# Patient Record
Sex: Male | Born: 1965 | Race: Black or African American | Hispanic: No | Marital: Married | Smoking: Never smoker
Health system: Southern US, Community
[De-identification: ages and names within clinical notes are randomized; demographics above are authoritative.]

## PROBLEM LIST (undated history)

## (undated) DIAGNOSIS — Z8719 Personal history of other diseases of the digestive system: Secondary | ICD-10-CM

## (undated) DIAGNOSIS — E119 Type 2 diabetes mellitus without complications: Secondary | ICD-10-CM

## (undated) DIAGNOSIS — I350 Nonrheumatic aortic (valve) stenosis: Secondary | ICD-10-CM

## (undated) DIAGNOSIS — E785 Hyperlipidemia, unspecified: Secondary | ICD-10-CM

## (undated) DIAGNOSIS — I7789 Other specified disorders of arteries and arterioles: Secondary | ICD-10-CM

## (undated) DIAGNOSIS — N200 Calculus of kidney: Secondary | ICD-10-CM

## (undated) DIAGNOSIS — E291 Testicular hypofunction: Secondary | ICD-10-CM

## (undated) DIAGNOSIS — I5042 Chronic combined systolic (congestive) and diastolic (congestive) heart failure: Secondary | ICD-10-CM

## (undated) DIAGNOSIS — I441 Atrioventricular block, second degree: Secondary | ICD-10-CM

## (undated) DIAGNOSIS — Z9889 Other specified postprocedural states: Secondary | ICD-10-CM

## (undated) DIAGNOSIS — G4730 Sleep apnea, unspecified: Secondary | ICD-10-CM

## (undated) DIAGNOSIS — M1990 Unspecified osteoarthritis, unspecified site: Secondary | ICD-10-CM

## (undated) DIAGNOSIS — I10 Essential (primary) hypertension: Secondary | ICD-10-CM

## (undated) DIAGNOSIS — I428 Other cardiomyopathies: Secondary | ICD-10-CM

## (undated) HISTORY — DX: Other specified postprocedural states: Z98.890

## (undated) HISTORY — DX: Hyperlipidemia, unspecified: E78.5

## (undated) HISTORY — DX: Other specified disorders of arteries and arterioles: I77.89

## (undated) HISTORY — DX: Chronic combined systolic (congestive) and diastolic (congestive) heart failure: I50.42

## (undated) HISTORY — PX: COSMETIC SURGERY: SHX468

## (undated) HISTORY — DX: Atrioventricular block, second degree: I44.1

## (undated) HISTORY — DX: Type 2 diabetes mellitus without complications: E11.9

## (undated) HISTORY — DX: Other cardiomyopathies: I42.8

## (undated) HISTORY — PX: CARDIAC CATHETERIZATION: SHX172

---

## 1998-03-11 ENCOUNTER — Emergency Department (HOSPITAL_COMMUNITY): Admission: EM | Admit: 1998-03-11 | Discharge: 1998-03-11 | Payer: Self-pay | Admitting: Emergency Medicine

## 2002-02-04 ENCOUNTER — Ambulatory Visit (HOSPITAL_BASED_OUTPATIENT_CLINIC_OR_DEPARTMENT_OTHER): Admission: RE | Admit: 2002-02-04 | Discharge: 2002-02-04 | Payer: Self-pay | Admitting: *Deleted

## 2008-03-11 ENCOUNTER — Emergency Department (HOSPITAL_COMMUNITY): Admission: EM | Admit: 2008-03-11 | Discharge: 2008-03-11 | Payer: Self-pay | Admitting: Emergency Medicine

## 2011-03-13 LAB — POCT I-STAT, CHEM 8
Calcium, Ion: 1.09 — ABNORMAL LOW
Glucose, Bld: 127 — ABNORMAL HIGH
HCT: 49
Hemoglobin: 16.7
TCO2: 28

## 2011-03-13 LAB — CBC
Hemoglobin: 15.3
MCHC: 33.3
MCV: 83.6
RBC: 5.49
RDW: 14.7

## 2011-03-13 LAB — DIFFERENTIAL
Basophils Absolute: 0
Basophils Relative: 0
Eosinophils Absolute: 0.1
Eosinophils Relative: 3
Monocytes Absolute: 0.5
Monocytes Relative: 11

## 2011-03-13 LAB — URINALYSIS, ROUTINE W REFLEX MICROSCOPIC
Bilirubin Urine: NEGATIVE
Hgb urine dipstick: NEGATIVE
Ketones, ur: NEGATIVE
Nitrite: NEGATIVE
pH: 6

## 2011-05-30 ENCOUNTER — Inpatient Hospital Stay: Admit: 2011-05-30 | Payer: Self-pay | Source: Ambulatory Visit | Admitting: Interventional Cardiology

## 2011-05-30 ENCOUNTER — Emergency Department (HOSPITAL_COMMUNITY): Payer: BC Managed Care – PPO

## 2011-05-30 ENCOUNTER — Other Ambulatory Visit: Payer: Self-pay

## 2011-05-30 ENCOUNTER — Inpatient Hospital Stay (HOSPITAL_COMMUNITY)
Admission: EM | Admit: 2011-05-30 | Discharge: 2011-06-02 | DRG: 124 | Disposition: A | Discharge status: Home / Self Care | Payer: BC Managed Care – PPO | Attending: Interventional Cardiology | Admitting: Interventional Cardiology

## 2011-05-30 DIAGNOSIS — I5023 Acute on chronic systolic (congestive) heart failure: Principal | ICD-10-CM | POA: Diagnosis present

## 2011-05-30 DIAGNOSIS — G4730 Sleep apnea, unspecified: Secondary | ICD-10-CM | POA: Diagnosis present

## 2011-05-30 DIAGNOSIS — Q231 Congenital insufficiency of aortic valve: Secondary | ICD-10-CM

## 2011-05-30 DIAGNOSIS — I441 Atrioventricular block, second degree: Secondary | ICD-10-CM | POA: Diagnosis present

## 2011-05-30 DIAGNOSIS — I5022 Chronic systolic (congestive) heart failure: Secondary | ICD-10-CM | POA: Diagnosis present

## 2011-05-30 DIAGNOSIS — N200 Calculus of kidney: Secondary | ICD-10-CM

## 2011-05-30 DIAGNOSIS — I10 Essential (primary) hypertension: Secondary | ICD-10-CM | POA: Diagnosis present

## 2011-05-30 DIAGNOSIS — R079 Chest pain, unspecified: Secondary | ICD-10-CM | POA: Diagnosis present

## 2011-05-30 DIAGNOSIS — I350 Nonrheumatic aortic (valve) stenosis: Secondary | ICD-10-CM | POA: Diagnosis present

## 2011-05-30 DIAGNOSIS — E291 Testicular hypofunction: Secondary | ICD-10-CM | POA: Diagnosis present

## 2011-05-30 HISTORY — DX: Unspecified osteoarthritis, unspecified site: M19.90

## 2011-05-30 HISTORY — DX: Calculus of kidney: N20.0

## 2011-05-30 HISTORY — DX: Essential (primary) hypertension: I10

## 2011-05-30 HISTORY — DX: Sleep apnea, unspecified: G47.30

## 2011-05-30 HISTORY — DX: Nonrheumatic aortic (valve) stenosis: I35.0

## 2011-05-30 HISTORY — DX: Testicular hypofunction: E29.1

## 2011-05-30 LAB — COMPREHENSIVE METABOLIC PANEL
ALT: 61 U/L — ABNORMAL HIGH (ref 0–53)
Albumin: 3.7 g/dL (ref 3.5–5.2)
Alkaline Phosphatase: 48 U/L (ref 39–117)
Calcium: 9.6 mg/dL (ref 8.4–10.5)
GFR calc Af Amer: 76 mL/min — ABNORMAL LOW (ref >90)
Glucose, Bld: 89 mg/dL (ref 70–99)
Potassium: 3.8 mEq/L (ref 3.5–5.1)
Sodium: 138 mEq/L (ref 135–145)
Total Protein: 7 g/dL (ref 6.0–8.3)

## 2011-05-30 LAB — CARDIAC PANEL(CRET KIN+CKTOT+MB+TROPI)
Relative Index: 1.9 (ref 0.0–2.5)
Total CK: 596 U/L — ABNORMAL HIGH (ref 7–232)
Troponin I: <0.3 ng/mL (ref <0.30)

## 2011-05-30 LAB — CBC
HCT: 53 % — ABNORMAL HIGH (ref 39.0–52.0)
Hemoglobin: 18.5 g/dL — ABNORMAL HIGH (ref 13.0–17.0)
RBC: 6.26 MIL/uL — ABNORMAL HIGH (ref 4.22–5.81)

## 2011-05-30 LAB — TSH: TSH: 2.768 u[IU]/mL (ref 0.350–4.500)

## 2011-05-30 LAB — PRO B NATRIURETIC PEPTIDE: Pro B Natriuretic peptide (BNP): 1100 pg/mL — ABNORMAL HIGH (ref 0–125)

## 2011-05-30 LAB — MAGNESIUM: Magnesium: 2 mg/dL (ref 1.5–2.5)

## 2011-05-30 LAB — D-DIMER, QUANTITATIVE: D-Dimer, Quant: 0.43 ug/mL-FEU (ref 0.00–0.48)

## 2011-05-30 MED ORDER — AMLODIPINE BESYLATE 5 MG PO TABS
5.0000 mg | ORAL_TABLET | Freq: Every day | ORAL | Status: DC
  Filled 2011-05-30: qty 1

## 2011-05-30 MED ORDER — HEPARIN BOLUS VIA INFUSION
4000.0000 [IU] | Freq: Once | INTRAVENOUS | Status: AC
  Administered 2011-05-30: 4000 [IU] via INTRAVENOUS
  Filled 2011-05-30: qty 4000

## 2011-05-30 MED ORDER — SODIUM CHLORIDE 0.9 % IJ SOLN: 3.0000 mL | INTRAMUSCULAR | Status: DC | PRN

## 2011-05-30 MED ORDER — OLMESARTAN MEDOXOMIL 40 MG PO TABS
40.0000 mg | ORAL_TABLET | ORAL | Status: AC
Start: 2011-05-30 — End: 2011-05-31
  Filled 2011-05-30: qty 1

## 2011-05-30 MED ORDER — HEPARIN SOD (PORCINE) IN D5W 100 UNIT/ML IV SOLN
1950.0000 [IU]/h | INTRAVENOUS | Status: DC
  Administered 2011-05-30: 1600 [IU]/h via INTRAVENOUS
  Administered 2011-05-31 – 2011-06-01 (×3): 1950 [IU]/h via INTRAVENOUS
  Filled 2011-05-30 (×7): qty 250

## 2011-05-30 MED ORDER — HYDROCHLOROTHIAZIDE 25 MG PO TABS
25.0000 mg | ORAL_TABLET | ORAL | Status: DC
  Filled 2011-05-30: qty 1

## 2011-05-30 MED ORDER — OLMESARTAN-AMLODIPINE-HCTZ 40-5-25 MG PO TABS: 1.0000 | ORAL_TABLET | Freq: Every day | ORAL | Status: DC

## 2011-05-30 MED ORDER — ACETAMINOPHEN 325 MG PO TABS: 650.0000 mg | ORAL_TABLET | ORAL | Status: DC | PRN

## 2011-05-30 MED ORDER — ONDANSETRON HCL 4 MG/2ML IJ SOLN: 4.0000 mg | Freq: Four times a day (QID) | INTRAMUSCULAR | Status: DC | PRN

## 2011-05-30 MED ORDER — HYDROCHLOROTHIAZIDE 25 MG PO TABS
25.0000 mg | ORAL_TABLET | Freq: Every day | ORAL | Status: DC
  Filled 2011-05-30: qty 1

## 2011-05-30 MED ORDER — AMLODIPINE BESYLATE 5 MG PO TABS
5.0000 mg | ORAL_TABLET | ORAL | Status: DC
  Filled 2011-05-30: qty 1

## 2011-05-30 MED ORDER — FUROSEMIDE 10 MG/ML IJ SOLN
40.0000 mg | Freq: Once | INTRAMUSCULAR | Status: AC
  Administered 2011-05-30: 40 mg via INTRAVENOUS
  Filled 2011-05-30 (×2): qty 4

## 2011-05-30 MED ORDER — SODIUM CHLORIDE 0.9 % IJ SOLN
3.0000 mL | Freq: Two times a day (BID) | INTRAMUSCULAR | Status: DC
  Administered 2011-05-31: 3 mL via INTRAVENOUS

## 2011-05-30 MED ORDER — OLMESARTAN MEDOXOMIL 40 MG PO TABS
40.0000 mg | ORAL_TABLET | Freq: Every day | ORAL | Status: DC
  Filled 2011-05-30: qty 1

## 2011-05-30 MED ORDER — SODIUM CHLORIDE 0.9 % IV SOLN: 250.0000 mL | INTRAVENOUS | Status: DC | PRN

## 2011-05-30 MED ORDER — OLMESARTAN MEDOXOMIL 40 MG PO TABS
40.0000 mg | ORAL_TABLET | Freq: Every day | ORAL | Status: DC
  Administered 2011-05-31 – 2011-06-02 (×3): 40 mg via ORAL
  Filled 2011-05-30 (×3): qty 1

## 2011-05-30 NOTE — ED Notes (Signed)
Pt presents with 2 day h/o chest tightness.  Pt reports shortness of breath; pt reports coughing, unable to produce phlegm.  Pt reports shortness of breath worsens with exertion.

## 2011-05-30 NOTE — ED Notes (Signed)
RN unable to come to phone.  Phone number and name given to Diplomatic Services operational officer.   RN to call back.

## 2011-05-30 NOTE — ED Notes (Signed)
Called IV team  

## 2011-05-30 NOTE — ED Notes (Signed)
RN unable to come to phone.  Phone number and name given.  Will return phone call in a few minutes.

## 2011-05-30 NOTE — Progress Notes (Signed)
ANTICOAGULATION CONSULT NOTE - Initial Consult  Pharmacy Consult for heparin Indication: ACS  Allergies  Allergen Reactions  . Atenolol Swelling    Erectile dysfunction    Patient Measurements: Height: 6\' 4"  (193 cm) Weight: 338 lb (153.316 kg) IBW/kg (Calculated) : 86.8  Heparin dosing Weight: 121.9kg  Vital Signs: Temp: 98.2 F (36.8 C) (12/18 1716) Temp src: Oral (12/18 1716) BP: 146/103 mmHg (12/18 1716) Pulse Rate: 68  (12/18 1716)  Labs:  Basename 05/30/11 1601 05/30/11 1539  HGB -- 18.5*  HCT -- 53.0*  PLT -- 263  APTT -- --  LABPROT -- 14.3  INR -- 1.09  HEPARINUNFRC -- --  CREATININE -- --  CKTOTAL 596* --  CKMB 11.5* --  TROPONINI <0.30 --   Estimated Creatinine Clearance: 117.5 ml/min (by C-G formula based on Cr of 1.3).  Medical History: Past Medical History  Diagnosis Date  . Hypertension   . Aortic stenosis   . Kidney stone     nephrolithiasis  . Hypogonadism male     Medications (PTA):  Ibuprofen, magnesium oxide, tribensor, levitra, testosterone  Assessment: 45 yo male with SOB/CP and concern for ACS to start heparin  Goal of Therapy:  Heparin level 0.3-0.7 units/ml   Plan:  1) Will given heparin bolus of 4000 units then infuse at 1600 units/hr (~12 units/kg/hr). 2) Heparin level in 6 hours and daily with CBC daily   Benny Lennert 05/30/2011,5:49 PM

## 2011-05-30 NOTE — ED Notes (Signed)
Pt to ED for eval of abnormal EKG. Pt states that last night at work he had an episode of sob and chest tightness. Pt reports he also had abdominal discomfort that was relieved by antacids. Pt denies any chest pain, sob, abd pain at this time. Pt moved to yellow and placed back on cardiac monitor. Wife at bedside. Pt with noted non pitting edema to lower extremities. Pt reports he had episodes of sob intermittently a couple months ago with he was going to the gym. Pt is aware of poc. Pt and wife given drinks.

## 2011-05-30 NOTE — ED Notes (Signed)
Meal tray ordered 

## 2011-05-30 NOTE — ED Notes (Signed)
[id number] ready

## 2011-05-30 NOTE — ED Notes (Signed)
IV team at bedside 

## 2011-05-30 NOTE — ED Notes (Signed)
Pt was at [facility] cardio for shortness of breath issues for past week.  Pt EKG showed 1st degree heart block.  Sent to ED to see Dr Mayford Knife. ED EKG did not show the heart block.  MD turner is admitting pt for further testing.   Pt states that he was bending over at work and started having shortness of breath.  Denies pain and pressure.  States he had very bad heartburn last night but was relieved by antacids

## 2011-05-30 NOTE — ED Notes (Signed)
Dr Mayford Knife is aware of CKMB, new orders received

## 2011-05-30 NOTE — ED Notes (Signed)
Dr. Turner at bedside.

## 2011-05-30 NOTE — H&P (Signed)
Admit date: 05/30/2011 Referring Physician Dr. Laurann Montana Primary Cardiologist  Dr. Verdis Prime Chief complaint/reason for admission:   SOB, intermittent second degree AV block, chest pressure  HPI: This is a 45yo African American Male with history of mild to moderate AS and mild AI by echo 2011, Hypertension who presented to Dr. Lucilla Lame office today with complaints of increased SOB and chest pressure.  Over the past week he has noticed a sensation of "fluid" in his right lung when he lays down.  He has had an occasional cough but no fever, chills or productivity to the cough.  Last evening while working at his job [employer] at [employer], he noticed that he was SOB whenever he bent over.  He denied any chest pain but noticed some tightness in his chest.  He also has noticed swelling in his legs over the past week and says that the only time he normally has some edema is in the summer.  He currently denies any chest pain.  In Dr. Lucilla Lame office he was noticed to have intermitten 2:1 second degree AV block.  He was transported to the ER and in the ER his EKG now shows NSR with inverted T waves in the lateral precordial leads    PMH:    Past Medical History  Diagnosis Date  . Hypertension   . Mild to moderate AS and mild AI by echo 2011   . Kidney stone     nephrolithiasis  . Hypogonadism male Erectile dysfunction      PSH:   History reviewed. No pertinent past surgical history.  ALLERGIES:   Atenolol (causes erectile dysfunction and edema)  Prior to Admit Meds:    Magnesium ? Dose  Ibuprofen 200mg  1 tablet as needed  Levitra 20mg  1 tablet as needed   Testosterone injection every 2 weeks  Bystolic 10mg  daily  Tribenzor 40-5-25mg  1 tablet daily Family HX:   History reviewed. No pertinent family history. Social HX:    History   Social History  . Marital Status: Married    Spouse Name: N/A    Number of Children: N/A  . Years of Education: N/A   Occupational History  .  Not on file.   Social History Main Topics  . Smoking status: Never Smoker   . Smokeless tobacco: Not on file  . Alcohol Use: No  . Drug Use: No  . Sexually Active:    Other Topics Concern  . Not on file   Social History Narrative  . No narrative on file     ROS:  All 11 ROS were addressed and are negative except what is stated in the HPI  PHYSICAL EXAM Filed Vitals:   05/30/11 1336  BP: 134/105  Pulse: 68  Temp: 98.4 F (36.9 C)  Resp: 20   General: Well developed, well nourished, in no acute distress Head: Eyes PERRLA, No xanthomas.   Normal cephalic and atramatic  Lungs:   Few crackles at right base otherwise CTA. Heart:   HRRR S1 S2 Pulses are 2+ & equal.            No carotid bruit. No JVD.  No abdominal bruits. No femoral bruits. Abdomen: Bowel sounds are positive, abdomen soft and non-tender without masses  Extremities:  1+ edema bilaterally Neuro: Alert and oriented X 3. Psych:  Good affect, responds appropriately   Labs:   Lab Results  Component Value Date   WBC 4.9 03/11/2008   HGB 16.7 03/11/2008  HCT 49.0 03/11/2008   MCV 83.6 03/11/2008   PLT 235 03/11/2008         Radiology:  Pending  EKG:  NSR with1st degree AV block,  nonspecific T wave abnormality V5 and V6, Biatrial enlargement  EKG in MD office NSR with 2:1 AV block  ASSESSMENT:  1.  Transient second degree AV block now in NSR 2.  SOB most likely secondary to CHF 3. Probable  Acute diastolic CHF 4.  Mild to moderate AS and mild AI by echo 2011 5.  Mild chest pressure now resolved most likely secondary to #3  PLAN:   1.  Admit to tele bed 2.  Stop bystolic 3.  Stat CMET, BNP, TSH, DDimer 4.  Lasix 40mg  IV now 5.  Pa and lat CXRAY 6.  2D echo reassess AS and LVF 7.  Further workup per Dr. Flossie Dibble, MD  05/30/2011  2:56 PM

## 2011-05-31 ENCOUNTER — Other Ambulatory Visit: Payer: Self-pay

## 2011-05-31 DIAGNOSIS — I10 Essential (primary) hypertension: Secondary | ICD-10-CM

## 2011-05-31 DIAGNOSIS — I441 Atrioventricular block, second degree: Secondary | ICD-10-CM

## 2011-05-31 LAB — BASIC METABOLIC PANEL
Calcium: 9.4 mg/dL (ref 8.4–10.5)
GFR calc Af Amer: 67 mL/min — ABNORMAL LOW (ref >90)
GFR calc non Af Amer: 58 mL/min — ABNORMAL LOW (ref >90)
Potassium: 3.4 mEq/L — ABNORMAL LOW (ref 3.5–5.1)
Sodium: 139 mEq/L (ref 135–145)

## 2011-05-31 LAB — CBC
Hemoglobin: 17.9 g/dL — ABNORMAL HIGH (ref 13.0–17.0)
MCH: 29.2 pg (ref 26.0–34.0)
Platelets: 264 10*3/uL (ref 150–400)
RBC: 6.13 MIL/uL — ABNORMAL HIGH (ref 4.22–5.81)
WBC: 6.5 10*3/uL (ref 4.0–10.5)

## 2011-05-31 LAB — CARDIAC PANEL(CRET KIN+CKTOT+MB+TROPI)
CK, MB: 6.5 ng/mL (ref 0.3–4.0)
Relative Index: 1.8 (ref 0.0–2.5)
Relative Index: 1.8 (ref 0.0–2.5)
Total CK: 355 U/L — ABNORMAL HIGH (ref 7–232)
Troponin I: <0.3 ng/mL (ref <0.30)

## 2011-05-31 LAB — HEPARIN LEVEL (UNFRACTIONATED)
Heparin Unfractionated: 0.14 IU/mL — ABNORMAL LOW (ref 0.30–0.70)
Heparin Unfractionated: 0.43 IU/mL (ref 0.30–0.70)

## 2011-05-31 LAB — APTT: aPTT: 62 seconds — ABNORMAL HIGH (ref 24–37)

## 2011-05-31 MED ORDER — HEPARIN BOLUS VIA INFUSION
2500.0000 [IU] | Freq: Once | INTRAVENOUS | Status: AC
  Administered 2011-05-31: 2500 [IU] via INTRAVENOUS
  Filled 2011-05-31: qty 2500

## 2011-05-31 MED ORDER — AMLODIPINE BESYLATE 10 MG PO TABS
10.0000 mg | ORAL_TABLET | Freq: Every day | ORAL | Status: DC
  Administered 2011-05-31 – 2011-06-02 (×3): 10 mg via ORAL
  Filled 2011-05-31 (×3): qty 1

## 2011-05-31 NOTE — Progress Notes (Addendum)
Patient Name: Joshua [name] Date of Encounter: 05/31/2011    SUBJECTIVE: Feels better.    TELEMETRY:  Occasional second-degree AV block, ? Mobitz 1 as there is slight post block PR shortening. Filed Vitals:   05/30/11 2000 05/30/11 2100 05/30/11 2300 05/31/11 0501  BP: 142/97 148/103 157/111 159/96  Pulse: 66 67 73 76  Temp:  98.4 F (36.9 C) 98.3 F (36.8 C) 97.6 F (36.4 C)  TempSrc:  Oral Oral Oral  Resp: 19 20 18 18   Height:      Weight:   150.367 kg (331 lb 8 oz)   SpO2: 96% 95% 95% 94%    Intake/Output Summary (Last 24 hours) at 05/31/11 0907 Last data filed at 05/31/11 0636  Gross per 24 hour  Intake 769.93 ml  Output      0 ml  Net 769.93 ml    LABS: Basic Metabolic Panel:  Basename 05/31/11 0229 05/30/11 1539  NA 139 138  K 3.4* 3.8  CL 99 98  CO2 30 24  GLUCOSE 92 89  BUN 16 14  CREATININE 1.44* 1.31  CALCIUM 9.4 9.6  MG -- 2.0  PHOS -- --   CBC:  Basename 05/31/11 0229 05/30/11 1539  WBC 6.5 6.9  NEUTROABS -- --  HGB 17.9* 18.5*  HCT 51.6 53.0*  MCV 84.2 84.7  PLT 264 263   Cardiac Enzymes:  Basename 05/30/11 2347 05/30/11 1601  CKTOTAL 484* 596*  CKMB 8.7* 11.5*  CKMBINDEX -- --  TROPONINI <0.30 <0.30   BNP: 1100  ECG:EKG on admission demonstrated marked atrial abnormality with mild first-degree AV block. No acute ST-T wave changes.  Radiology/Studies:   CXR: IMPRESSION:  No evidence of acute cardiopulmonary disease.  Mild cardiomegaly.  Original Report Authenticated By: Charline Bills, M.D.   Physical Exam: Blood pressure 159/96, pulse 76, temperature 97.6 F (36.4 C), temperature source Oral, resp. rate 18, height 6\' 4"  (1.93 m), weight 150.367 kg (331 lb 8 oz), SpO2 94.00%. Weight change:    S4 gallop. 2-3 of 6 crescendo decrescendo systolic murmur and also to of 6 decrescendo murmur of aortic regurgitation  ASSESSMENT:  1. AV block, intrinsic, aggravated by beta blocker therapy. AV disease could be related to  the patient's known bowel heart disease.  2. Aortic valve disease. Will reevaluate with echo this admission.  3. Acute diastolic heart failure secondary to dysrhythmia.  4. Poorly controlled hypertension  5. Elevated CK/MB with normal troponins.   Plan:  1. Diuresis  2. EP evaluation, ? pacer  3. Further management decisions will depend on the appearance of echocardiogram.  4. Intensify antihypertensive regimen, by increasing amlodipine  5. Increase activity  6. May need ischemic eval.  Signed, Lesleigh Noe 05/31/2011, 9:07 AM

## 2011-05-31 NOTE — Progress Notes (Signed)
ANTICOAGULATION CONSULT NOTE - Follow Up Consult  Pharmacy Consult:  Heparin Indication: ACS  Allergies  Allergen Reactions  . Atenolol Swelling    Erectile dysfunction    Patient Measurements: Height: 6\' 4"  (193 cm) Weight: 331 lb 8 oz (150.367 kg) IBW/kg (Calculated) : 86.8   Vital Signs: Temp: 97.6 F (36.4 C) (12/19 0501) Temp src: Oral (12/19 0501) BP: 159/96 mmHg (12/19 0501) Pulse Rate: 76  (12/19 0501)  Labs:  Basename 05/31/11 1200 05/31/11 0905 05/31/11 0229 05/30/11 2347 05/30/11 2346 05/30/11 1601 05/30/11 1539  HGB -- -- 17.9* -- -- -- 18.5*  HCT -- -- 51.6 -- -- -- 53.0*  PLT -- -- 264 -- -- -- 263  APTT -- -- -- -- 62* -- --  LABPROT -- -- -- -- -- -- 14.3  INR -- -- -- -- -- -- 1.09  HEPARINUNFRC 0.43 -- 0.14* -- -- -- --  CREATININE -- -- 1.44* -- -- -- 1.31  CKTOTAL -- 355* -- 484* -- 596* --  CKMB -- 6.5* -- 8.7* -- 11.5* --  TROPONINI -- <0.30 -- <0.30 -- <0.30 --   Estimated Creatinine Clearance: 105 ml/min (by C-G formula based on Cr of 1.44).   Assessment: 45 YOM on heparin for ACS.  Heparin level therapeutic post rate increase.  No bleeding documented.  Goal of Therapy:  HL 0.3-0.7   Plan:  1.  Continue heparin gtt at 1950 units/hr 2.  F/U AM labs, ECHO   Phillips Climes Dien 05/31/2011,12:58 PM

## 2011-05-31 NOTE — Progress Notes (Signed)
UR Completed.  Joshua [name] 05/31/2011 [phone]  

## 2011-05-31 NOTE — Consult Note (Signed)
ELECTROPHYSIOLOGY CONSULT NOTE    Patient ID: Joshua [name] MRN: [id number], DOB/AGE: 03/03/1966 45 y.o.  Admit date: 05/30/2011 Date of Consult: 05-31-2011  Primary Cardiologist: Verdis Prime, MD  Reason for Consultation: AV block  HPI:  Joshua [name] is a 45yo male with history of mild to moderate AS and mild AI by echo 2011 and hypertension who presented to Dr. Lucilla Lame office today with complaints of increased SOB and chest pressure. Over the past week he has noticed a sensation of "fluid" in his right lung when he lays down. He has had an occasional cough but no fever, chills or productivity to the cough. Last evening while working at his job [employer] at [employer], he noticed that he was SOB whenever he bent over he reports associated tightness in his chest. He also has noticed swelling in his legs over the past week and says that the only time he normally has some edema is in the summer. He currently denies any chest pain. In Dr. Lucilla Lame office he was noticed to have intermitten 2:1 second degree AV block.  He was therefore admitted to [facility] for further evaluation.  AV nodal blocking medications on admission included Bystolic which has since been discontinued- last dose yesterday.   Past Medical History  Diagnosis Date  . Hypertension   . Aortic stenosis   . Kidney stone     nephrolithiasis  . Hypogonadism male      Surgical History: History reviewed. No pertinent past surgical history.   Prescriptions prior to admission  Medication Sig Dispense Refill  . ibuprofen (ADVIL,MOTRIN) 200 MG tablet Take 200 mg by mouth every 6 (six) hours as needed. For pain      . magnesium oxide (MAG-OX) 400 MG tablet Take 400 mg by mouth daily.        . nebivolol (BYSTOLIC) 10 MG tablet Take 10 mg by mouth daily.        . Olmesartan-Amlodipine-HCTZ (TRIBENZOR) 40-5-25 MG TABS Take 1 tablet by mouth daily.        Marland Kitchen testosterone cypionate (DEPOTESTOTERONE CYPIONATE) 200 MG/ML  injection Inject into the muscle every 14 (fourteen) days.       . vardenafil (LEVITRA) 20 MG tablet Take 20 mg by mouth daily as needed.          Inpatient Medications:    . amLODipine  10 mg Oral Daily  . furosemide  40 mg Intravenous Once  . heparin  2,500 Units Intravenous Once  . heparin  4,000 Units Intravenous Once  . olmesartan  40 mg Oral To Minor  . olmesartan  40 mg Oral Daily    Allergies:  Allergies  Allergen Reactions  . Atenolol Swelling    Erectile dysfunction    History   Social History  . Marital Status: Married    Spouse Name: N/A    Number of Children: N/A  . Years of Education: N/A   Occupational History  . Not on file.   Social History Main Topics  . Smoking status: Never Smoker   . Smokeless tobacco: Not on file  . Alcohol Use: No  . Drug Use: No  . Sexually Active:    Other Topics Concern  . Not on file   Social History Narrative  . No narrative on file    FH:  Denies FH of early CAD, or premature conduction disease  Labs:   Lab Results  Component Value Date   WBC 6.5 05/31/2011  HGB 17.9* 05/31/2011   HCT 51.6 05/31/2011   MCV 84.2 05/31/2011   PLT 264 05/31/2011    Lab 05/31/11 0229 05/30/11 1539  NA 139 --  K 3.4* --  CL 99 --  CO2 30 --  BUN 16 --  CREATININE 1.44* --  CALCIUM 9.4 --  PROT -- 7.0  BILITOT -- 0.5  ALKPHOS -- 48  ALT -- 61*  AST -- 54*  GLUCOSE 92 --   Lab Results  Component Value Date   CKTOTAL 355* 05/31/2011   CKMB 6.5* 05/31/2011   TROPONINI <0.30 05/31/2011    Lab Results  Component Value Date   DDIMER 0.43 05/30/2011     Radiology/Studies: Dg Chest 2 View 05/30/2011  *RADIOLOGY REPORT*  Clinical Data: Shortness of breath  CHEST - 2 VIEW  Comparison: None.  Findings: Lungs are essentially clear. No pleural effusion or pneumothorax.  Mild cardiomegaly.  Mild degenerative changes of the visualized thoracolumbar spine.  IMPRESSION: No evidence of acute cardiopulmonary disease.  Mild  cardiomegaly.  Original Report Authenticated By: Charline Bills, M.D.   TELEMETRY: Sinus rhythm with intermittent 2:1 block.  Occasional PVC's, narrow QRS  EKG reveals sinus rhythm 67 bpm, PR 220, QRS 98, Qtc 448, biatrial enlargement, nonspecific ST/T changes  Echo- pending  Assessment/Impression   Second degree AV block-  Joshua [name] is admitted with symptomatic 2:1 AV block.  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  He is chronically treated with bystolic and this has been discontinued.  He has symptoms of chest discomfort, SOB, and worsening LE edema recently.  I agree with Dr Katrinka Blazing that it would be important to exclude ischemia.  Given AV block, I would consider cath as lexiscan and GXT would be higher risk stress modalities for this patient with poor AV conduction. His Echo is pending.  I think that close inspection of the Aortic valve and septum is important to look for structural causes of AV block (such as increased calcification or infiltrative process). If AV conduction completely normalizes off of bystolic, we may be able to avoid PPM, however if further AV block occurs after bystolic washout, we may have to proceed with PPM.  Will follow closely with you.  Hillis Range, MD 4:41 PM 05/31/2011

## 2011-05-31 NOTE — Progress Notes (Signed)
Initial heparin level 0.14 at 1600 units/hr. No bleeding reported. Will rebolus 2500 unitsx1 and increase to1950 units/hr (16u/kg/hr). Will recheck next heparin level at 11am

## 2011-06-01 ENCOUNTER — Encounter (HOSPITAL_COMMUNITY): Payer: Self-pay | Admitting: General Practice

## 2011-06-01 ENCOUNTER — Other Ambulatory Visit: Payer: Self-pay

## 2011-06-01 ENCOUNTER — Encounter (HOSPITAL_COMMUNITY)
Admission: EM | Disposition: A | Discharge status: Home / Self Care | Payer: Self-pay | Source: Home / Self Care | Attending: Interventional Cardiology

## 2011-06-01 HISTORY — PX: LEFT HEART CATHETERIZATION WITH CORONARY ANGIOGRAM: SHX5451

## 2011-06-01 LAB — CBC
Hemoglobin: 18.2 g/dL — ABNORMAL HIGH (ref 13.0–17.0)
MCH: 28.9 pg (ref 26.0–34.0)
Platelets: 241 10*3/uL (ref 150–400)
RBC: 6.3 MIL/uL — ABNORMAL HIGH (ref 4.22–5.81)
WBC: 6.4 10*3/uL (ref 4.0–10.5)

## 2011-06-01 LAB — BASIC METABOLIC PANEL
BUN: 14 mg/dL (ref 6–23)
Chloride: 102 mEq/L (ref 96–112)
GFR calc Af Amer: 80 mL/min — ABNORMAL LOW (ref >90)
GFR calc non Af Amer: 69 mL/min — ABNORMAL LOW (ref >90)
Potassium: 3.7 mEq/L (ref 3.5–5.1)
Sodium: 140 mEq/L (ref 135–145)

## 2011-06-01 LAB — HEPARIN LEVEL (UNFRACTIONATED): Heparin Unfractionated: 0.33 IU/mL (ref 0.30–0.70)

## 2011-06-01 SURGERY — LEFT HEART CATHETERIZATION WITH CORONARY ANGIOGRAM
Anesthesia: LOCAL

## 2011-06-01 MED ORDER — ASPIRIN 81 MG PO CHEW: 324.0000 mg | CHEWABLE_TABLET | ORAL | Status: DC

## 2011-06-01 MED ORDER — SODIUM CHLORIDE 0.9 % IV SOLN
INTRAVENOUS | Status: DC
  Administered 2011-06-01 (×2): via INTRAVENOUS

## 2011-06-01 MED ORDER — HEPARIN SODIUM (PORCINE) 1000 UNIT/ML IJ SOLN
INTRAMUSCULAR | Status: AC
  Filled 2011-06-01: qty 1

## 2011-06-01 MED ORDER — VERAPAMIL HCL 2.5 MG/ML IV SOLN
INTRAVENOUS | Status: AC
  Filled 2011-06-01: qty 2

## 2011-06-01 MED ORDER — NITROGLYCERIN 0.2 MG/ML ON CALL CATH LAB
INTRAVENOUS | Status: AC
  Filled 2011-06-01: qty 1

## 2011-06-01 MED ORDER — OXYCODONE-ACETAMINOPHEN 5-325 MG PO TABS
1.0000 | ORAL_TABLET | ORAL | Status: DC | PRN
  Administered 2011-06-02: 1 via ORAL
  Filled 2011-06-01: qty 1

## 2011-06-01 MED ORDER — DIAZEPAM 5 MG PO TABS
5.0000 mg | ORAL_TABLET | ORAL | Status: AC
  Administered 2011-06-01: 5 mg via ORAL
  Filled 2011-06-01: qty 1

## 2011-06-01 MED ORDER — FENTANYL CITRATE 0.05 MG/ML IJ SOLN
INTRAMUSCULAR | Status: AC
  Filled 2011-06-01: qty 2

## 2011-06-01 MED ORDER — SODIUM CHLORIDE 0.9 % IV SOLN: INTRAVENOUS | Status: AC

## 2011-06-01 MED ORDER — FUROSEMIDE 40 MG PO TABS
40.0000 mg | ORAL_TABLET | Freq: Every day | ORAL | Status: DC
  Administered 2011-06-01 – 2011-06-02 (×2): 40 mg via ORAL
  Filled 2011-06-01 (×2): qty 1

## 2011-06-01 MED ORDER — MIDAZOLAM HCL 2 MG/2ML IJ SOLN
INTRAMUSCULAR | Status: AC
  Filled 2011-06-01: qty 2

## 2011-06-01 MED ORDER — LIDOCAINE HCL (PF) 1 % IJ SOLN
INTRAMUSCULAR | Status: AC
  Filled 2011-06-01: qty 30

## 2011-06-01 MED ORDER — SODIUM CHLORIDE 0.9 % IV SOLN: INTRAVENOUS | Status: DC

## 2011-06-01 MED ORDER — HEPARIN (PORCINE) IN NACL 2-0.9 UNIT/ML-% IJ SOLN
INTRAMUSCULAR | Status: AC
  Filled 2011-06-01: qty 2000

## 2011-06-01 MED FILL — Perflutren Lipid Microsphere IV Susp 6.52 MG/ML: INTRAVENOUS | Qty: 2 | Status: AC

## 2011-06-01 NOTE — Op Note (Signed)
     Diagnostic Cardiac Catheterization Report  Joshua [name]  45 y.o.  male Feb 03, 1966  Procedure Date: @PROCEDUREDT @ Referring Physician: Dr. Laurann Montana Primary Cardiologist: Dr. Darci Needle, III, M.D.   PROCEDURE:  Left heart catheterization with selective coronary angiography.  INDICATIONS:  Second degree heart block with procedure indication to exclude the possibility of ischemia as a cause of the conduction abnormality.  The risks, benefits, and details of the procedure were explained to the patient.  The patient verbalized understanding and wanted to proceed.  Informed written consent was obtained.  PROCEDURE TECHNIQUE:  After Xylocaine anesthesia a 5 French sheath was placed in the right radial artery with a single anterior needle wall stick.   Coronary angiography was done using a multiple 5 French catheters with the left coronary via a 5 French #4 Judkins and right coronary with a 5 [place] EBU catheter.   CONTRAST:  Total of 105 cc.  COMPLICATIONS:  None.    HEMODYNAMICS:  Aortic pressure was 143/113 mmHg; LV pressure was not recorded; LVEDP not recorded.    ANGIOGRAPHIC DATA:   The left main coronary artery is widely patent and greater than 6 mm in diameter.  The left anterior descending artery is widely patent and greater than 5 mm in diameter proximally and greater than 4-1/2 mm in the mid vessel. Multiple cardiac cycles were required before full opacification of the left coronary system. No significant obstructive lesions were noted. A large first diagonal branch arises from the LAD.Marland Kitchen  The left circumflex artery is 3 obtuse marginal branches with the second obtuse marginal bifurcating inferolaterally. The circumflex is widely patent..  The right coronary artery is ectatic and fills sluggishly. No discrete high-grade obstruction is noted. Images were less than optimal. I raise a question about the possibility of a nonflow limiting dissection in the border  zone between the mid and distal RCA. There is no contrast hangup or other features of hemodynamically significant obstruction. Final impression after multiple reviews is that the area of concern represents streaming of contrast due to the size of the vessel. Coronary angiography is performed from the radial approach in this gentleman is difficult because of the size of his aorta and the aortic valve jet produced by the bicuspid valve.Marland Kitchen  LEFT VENTRICULOGRAM:  Left ventricular angiogram was not done.  IMPRESSIONS:  1. Marked left and right coronary ectasia with no evidence of fixed obstructive disease. The mid to distal right coronary may have a dissection plane, but it is more likely to be streaming flow.    2. Left ventriculography not performed. LVEF 40% by echo done 05/31/2011   RECOMMENDATION:  Consideration of DDD pacemaker. After pacemaker insertion I will start Plavix for 4-6 weeks. After pacemaker therapy if there is any recurrence of symptoms that would suggest angina repeat angiography would be a consideration from the femoral approach.Marland Kitchen

## 2011-06-01 NOTE — Progress Notes (Signed)
SUBJECTIVE: The patient is doing well today.  At this time, he denies chest pain, shortness of breath, or any new concerns.     Marland Kitchen amLODipine  10 mg Oral Daily  . aspirin  324 mg Oral Pre-Cath  . diazepam  5 mg Oral On Call  . olmesartan  40 mg Oral To Minor  . olmesartan  40 mg Oral Daily  . sodium chloride  3 mL Intravenous Q12H      . sodium chloride    . heparin 1,950 Units/hr (05/31/11 2034)    OBJECTIVE: Physical Exam: Filed Vitals:   05/31/11 0501 05/31/11 1430 05/31/11 2113 06/01/11 0535  BP: 159/96 133/85 144/97 142/96  Pulse: 76 72 71 70  Temp: 97.6 F (36.4 C) 98.5 F (36.9 C) 98.4 F (36.9 C) 98.1 F (36.7 C)  TempSrc: Oral Oral Oral Oral  Resp: 18 20 18 18   Height:      Weight:    329 lb 8 oz (149.46 kg)  SpO2: 94% 93% 95% 95%    Intake/Output Summary (Last 24 hours) at 06/01/11 1016 Last data filed at 06/01/11 0536  Gross per 24 hour  Intake  663.3 ml  Output    650 ml  Net   13.3 ml    Telemetry reveals sinus rhythm with occasional second degree AV block,  No consecutive P waves not conducted  GEN- The patient is well appearing, alert and oriented x 3 today.   Head- normocephalic, atraumatic Eyes-  Sclera clear, conjunctiva pink Ears- hearing intact Oropharynx- clear Neck- supple, no JVP Lymph- no cervical lymphadenopathy Lungs- Clear to ausculation bilaterally, normal work of breathing Heart- Regular rate and rhythm, 2/6 SEM LUSB GI- soft, NT, ND, + BS Extremities- no clubbing, cyanosis, or edema Skin- no rash or lesion Psych- euthymic mood, full affect Neuro- strength and sensation are intact  LABS: Basic Metabolic Panel:  Basename 06/01/11 0721 05/31/11 0229 05/30/11 1539  NA 140 139 --  K 3.7 3.4* --  CL 102 99 --  CO2 25 30 --  GLUCOSE 108* 92 --  BUN 14 16 --  CREATININE 1.25 1.44* --  CALCIUM 8.8 9.4 --  MG -- -- 2.0  PHOS -- -- --   Liver Function Tests:  [facility] 05/30/11 1539  AST 54*  ALT 61*  ALKPHOS 48    BILITOT 0.5  PROT 7.0  ALBUMIN 3.7   No results found for this basename: LIPASE:2,AMYLASE:2 in the last 72 hours CBC:  Basename 06/01/11 0721 05/31/11 0229  WBC 6.4 6.5  NEUTROABS -- --  HGB 18.2* 17.9*  HCT 52.7* 51.6  MCV 83.7 84.2  PLT 241 264   Cardiac Enzymes:  Basename 05/31/11 0905 05/30/11 2347 05/30/11 1601  CKTOTAL 355* 484* 596*  CKMB 6.5* 8.7* 11.5*  CKMBINDEX -- -- --  TROPONINI <0.30 <0.30 <0.30   BNP: No components found with this basename: POCBNP:3 D-Dimer:  Basename 05/30/11 1530  DDIMER 0.43   Hemoglobin A1C: No results found for this basename: HGBA1C in the last 72 hours Fasting Lipid Panel: No results found for this basename: CHOL,HDL,LDLCALC,TRIG,CHOLHDL,LDLDIRECT in the last 72 hours Thyroid Function Tests:  Basename 05/30/11 1539  TSH 2.768  T4TOTAL --  T3FREE --  THYROIDAB --    ASSESSMENT AND PLAN:  Principal Problem:  *SOB (shortness of breath) Active Problems:  Chest pain  Heart block AV second degree  Aortic stenosis, mild  Hypertension  Hypogonadism male  Nephrolithiasis  1. Second degree AV block-  Telemetry continues  to show occasional second degree AV block.  There is some PR prolongation at times, suggesting that this may be Mobitz I.  Continue to hold AV nodal agents. Given abnormal EF by echo, I agree with cath today to further evaluate his coronary anatomy.   Will continue to monitor. Would like to avoid PPM if possible in this young male.    Hillis Range, MD 06/01/2011 10:16 AM

## 2011-06-01 NOTE — Progress Notes (Signed)
TR BAND REMOVAL  LOCATION:    right radial  DEFLATED PER PROTOCOL:    yes  TIME BAND OFF / DRESSING APPLIED:    1745   SITE UPON ARRIVAL:    Level 0  SITE AFTER BAND REMOVAL:    Level 0  REVERSE ALLEN'S TEST:     positive  CIRCULATION SENSATION AND MOVEMENT:    Within Normal Limits   yes  COMMENTS:   Tolerated procedure well  

## 2011-06-01 NOTE — H&P (Signed)
The patient was admitted with second degree heart block. Echocardiogram demonstrated an inferior wall motion abnormality. Electrophysiology saw the patient but requested ischemic evaluation prior to considering permanent pacemaker insertion. Because of the heart block the patient was not eligible for a pharmacologic or a stress nuclear study. Hence left heart catheterization is being performed to clear the coronary anatomy and help guide therapy with reference to pacemaker insertion.

## 2011-06-01 NOTE — Progress Notes (Signed)
ANTICOAGULATION CONSULT NOTE - Follow Up Consult  Pharmacy Consult:  Heparin Indication: ACS  Allergies  Allergen Reactions  . Atenolol Swelling    Erectile dysfunction   Vital Signs: Temp: 98.1 F (36.7 C) (12/20 0535) Temp src: Oral (12/20 0535) BP: 142/96 mmHg (12/20 0535) Pulse Rate: 70  (12/20 0535)  Labs:  Basename 06/01/11 0721 05/31/11 1200 05/31/11 0905 05/31/11 0229 05/30/11 2347 05/30/11 2346 05/30/11 1601 05/30/11 1539  HGB 18.2* -- -- 17.9* -- -- -- --  HCT 52.7* -- -- 51.6 -- -- -- 53.0*  PLT 241 -- -- 264 -- -- -- 263  APTT -- -- -- -- -- 62* -- --  LABPROT -- -- -- -- -- -- -- 14.3  INR -- -- -- -- -- -- -- 1.09  HEPARINUNFRC 0.33 0.43 -- 0.14* -- -- -- --  CREATININE 1.25 -- -- 1.44* -- -- -- 1.31  CKTOTAL -- -- 355* -- 484* -- 596* --  CKMB -- -- 6.5* -- 8.7* -- 11.5* --  TROPONINI -- -- <0.30 -- <0.30 -- <0.30 --   Estimated Creatinine Clearance: 120.6 ml/min (by C-G formula based on Cr of 1.25).   Assessment: Admit Complaint: ACS Pharmacy System-Based Medication Review: Anticoagulation: Pt started on IV heparin for ACS. Heparin level = 0.33 today. Plan for cath today. Pulmonary: No further SOB. 95%  Cardiovascular:HTN, AS - Second degree AV block. Off of bystolic. Trying to avoid PPM. Norvasc, benicar, ASA Endocrinology: Hypogonadoism Nephrology: Scr 1.25 Hematology/Oncology:CBC ok Best Practices: IV heparin  Goal of Therapy:  HL 0.3-0.7   Plan:  1. F/u after cath   Clide Cliff 06/01/2011,2:48 PM

## 2011-06-02 ENCOUNTER — Encounter (HOSPITAL_COMMUNITY): Payer: Self-pay

## 2011-06-02 DIAGNOSIS — I5022 Chronic systolic (congestive) heart failure: Secondary | ICD-10-CM | POA: Diagnosis present

## 2011-06-02 DIAGNOSIS — I5030 Unspecified diastolic (congestive) heart failure: Secondary | ICD-10-CM

## 2011-06-02 HISTORY — DX: Unspecified diastolic (congestive) heart failure: I50.30

## 2011-06-02 LAB — BASIC METABOLIC PANEL
CO2: 28 mEq/L (ref 19–32)
Calcium: 9.1 mg/dL (ref 8.4–10.5)
Calcium: 9.2 mg/dL (ref 8.4–10.5)
Creatinine, Ser: 1.15 mg/dL (ref 0.50–1.35)
GFR calc non Af Amer: 69 mL/min — ABNORMAL LOW (ref >90)
Glucose, Bld: 119 mg/dL — ABNORMAL HIGH (ref 70–99)
Potassium: 3.7 mEq/L (ref 3.5–5.1)
Sodium: 137 mEq/L (ref 135–145)

## 2011-06-02 LAB — CBC
MCV: 84.9 fL (ref 78.0–100.0)
Platelets: 246 10*3/uL (ref 150–400)
RDW: 14.5 % (ref 11.5–15.5)
WBC: 5.7 10*3/uL (ref 4.0–10.5)

## 2011-06-02 LAB — GLUCOSE, CAPILLARY: Glucose-Capillary: 108 mg/dL — ABNORMAL HIGH (ref 70–99)

## 2011-06-02 MED ORDER — SPIRONOLACTONE 50 MG PO TABS: 50.0000 mg | ORAL_TABLET | Freq: Every day | ORAL | Status: DC

## 2011-06-02 MED ORDER — CLOPIDOGREL BISULFATE 75 MG PO TABS: 75.0000 mg | ORAL_TABLET | Freq: Every day | ORAL | Status: DC

## 2011-06-02 NOTE — Discharge Summary (Signed)
Patient ID: COTEY RAKES MRN: [id number] DOB/AGE: 12/22/1965 45 y.o.  Admit date: 05/30/2011 Discharge date: 06/02/2011  Primary Discharge Diagnosis: Second degree AV block, Mobitz 1 vs 2 Secondary Discharge Diagnosis: 1. Acute on chronic systolic heart failure resolved with diuresis and resolution of AV block  2. Bicuspid Aortic valve disease with mild to moderate aortic stenosis and aortic regurgitation  3. Severe hypertension, difficult to control.  4. Untreated sleep apnea  5. 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 Echocardiography; 2. Coronary angiography  Consults: Hillis Range, M.D., electrophysiologist  Hospital Course: The patient was admitted to the hospital on 05/30/2011 after being noted to have second degree AV block. The evening prior to admission was associated with dyspnea on exertion and mild tightness in the chest while doing physical labor at work. The following morning at the primary care physician's office, Dr. Laurann Montana, he was found to be an asymptomatic second-degree heart block with periods of 2-1 AV conduction.  He was hospitalized and beta blocker therapy, I stolic, was discontinued. Electrophysiology consultation with Dr. Hillis Range was obtained. Echocardiography demonstrated LV enlargement and EF of 35-40%. Coronary angiography was performed by Dr. Verdis Prime demonstrating widely patent but severely ectatic coronary arteries with sluggish filling. There was some question about flow in the distal portion of the right coronary but no focal high-grade obstruction was seen.  On the day of discharge the patient underwent exercise treadmill testing and was able to get his heart rate up to 137 beats per minute without evidence of AV block. He also denied experienced chest discomfort or dyspnea to the level that he did while working on the evening prior to admission. The test was stopped however because of dyspnea. No  ischemic EKG changes were noted either during stress or in recovery. The blood pressure response was hypertensive, but his a.m. medications were held.  The patient will undergo outpatient evaluation for sleep apnea which he is known to have but has been noncompliant. He may also undergo a cardiac MR study to rule out infiltrative cardiomyopathy .   Discharge Exam: Blood pressure 149/98, pulse 69, temperature 97.7 F (36.5 C), temperature source Oral, resp. rate 18, height 6\' 4"  (1.93 m), weight 150.9 kg (332 lb 10.8 oz), SpO2 96.00%.  2/6 crescendo decrescendo systolic murmur and 1/ 6 decrescendo murmur of aortic regurgitation.  Labs:   Lab Results  Component Value Date   WBC 5.7 06/02/2011   HGB 17.0 06/02/2011   HCT 51.1 06/02/2011   MCV 84.9 06/02/2011   PLT 246 06/02/2011    Lab 06/02/11 0505 05/30/11 1539  NA 137 --  K 3.9 --  CL 100 --  CO2 28 --  BUN 11 --  CREATININE 1.15 --  CALCIUM 9.1 --  PROT -- 7.0  BILITOT -- 0.5  ALKPHOS -- 48  ALT -- 61*  AST -- 54*  GLUCOSE 101* --   Lab Results  Component Value Date   CKTOTAL 355* 05/31/2011   CKMB 6.5* 05/31/2011   TROPONINI <0.30 05/31/2011   ECHOCARDIOGRAM RESULT :Study Conclusions  - Left ventricle: There was mild concentric hypertrophy. Systolic function was mildly to moderately reduced. The estimated ejection fraction was in the range of 40% to 45%. Diffuse hypokinesis. There is severe hypokinesis of the basalinferoseptal myocardium; consistent with infarction. Doppler parameters are consistent with a reversible restrictive pattern, indicative of decreased left ventricular diastolic compliance and/or increased left atrial pressure (grade 3 diastolic dysfunction). Doppler parameters are consistent  with high ventricular filling pressure. - Aortic valve: Mildly to moderately calcified annulus. Bicuspid; severely thickened, moderately calcified leaflets. Transvalvular velocity was increased. There was mild  stenosis. Mild to moderate regurgitation. Valve area: 1.55cm^2(VTI). Valve area: 1.55cm^2 (Vmax). - Mitral valve: Mild regurgitation. Valve area by pressure half-time: 2.22cm^2. Valve area by continuity equation (using LVOT flow): 2.34cm^2. - Atrial septum: No defect or patent foramen ovale was identified.    Radiology:*RADIOLOGY REPORT*  Clinical Data: Shortness of breath  CHEST - 2 VIEW  Comparison: None.  Findings: Lungs are essentially clear. No pleural effusion or  pneumothorax.  Mild cardiomegaly.  Mild degenerative changes of the visualized thoracolumbar spine.  IMPRESSION:  No evidence of acute cardiopulmonary disease.  Mild cardiomegaly.  Original Report Authenticated By: Charline Bills  EKG: P pulmonale. Normal sinus rhythm.  FOLLOW UP PLANS AND APPOINTMENTS  Current Discharge Medication List    START taking these medications   Details  clopidogrel (PLAVIX) 75 MG tablet Take 1 tablet (75 mg total) by mouth daily. Qty: 30 tablet, Refills: 0    spironolactone (ALDACTONE) 50 MG tablet Take 1 tablet (50 mg total) by mouth daily. Qty: 30 tablet, Refills: 11      CONTINUE these medications which have NOT CHANGED   Details  magnesium oxide (MAG-OX) 400 MG tablet Take 400 mg by mouth daily.      Olmesartan-Amlodipine-HCTZ (TRIBENZOR) 40-5-25 MG TABS Take 1 tablet by mouth daily.      testosterone cypionate (DEPOTESTOTERONE CYPIONATE) 200 MG/ML injection Inject into the muscle every 14 (fourteen) days.     vardenafil (LEVITRA) 20 MG tablet Take 20 mg by mouth daily as needed.        STOP taking these medications     ibuprofen (ADVIL,MOTRIN) 200 MG tablet      nebivolol (BYSTOLIC) 10 MG tablet        Follow-up Information    Follow up with Lesleigh Noe on 06/28/2011. (11:15 AM)    Contact information:   [address] [employer] [facility] [place] [address] [phone]          BRING ALL  MEDICATIONS WITH YOU TO FOLLOW UP APPOINTMENTS  Time spent with patient to include physician time: 40 minutes Signed: Lesleigh Noe 06/02/2011, 9:53 AM

## 2011-06-02 NOTE — Progress Notes (Signed)
AV conduction normalized off of bystolic overnight. GXT reviewed with Dr Katrinka Blazing.  This reveals preserved 1:1 AV conduction with exercise. At this time, I would recommend that we avoid PPM in this 45 yo male. No further inpatient EP evaluation planned. Please call with questions. Pt aware to contact Dr Katrinka Blazing immediately with dizzinesss, presyncope, syncope, or symptoms of recurrent AV block. Avoid AV nodal agents long term.  Hillis Range, MD 10:51 AM 06/02/2011

## 2011-11-15 ENCOUNTER — Ambulatory Visit (HOSPITAL_COMMUNITY)
Admission: RE | Admit: 2011-11-15 | Discharge: 2011-11-15 | Disposition: A | Discharge status: Home / Self Care | Payer: BC Managed Care – PPO | Source: Ambulatory Visit | Attending: Surgery | Admitting: Surgery

## 2011-11-15 ENCOUNTER — Ambulatory Visit (INDEPENDENT_AMBULATORY_CARE_PROVIDER_SITE_OTHER): Payer: BC Managed Care – PPO | Admitting: Surgery

## 2011-11-15 ENCOUNTER — Encounter (INDEPENDENT_AMBULATORY_CARE_PROVIDER_SITE_OTHER): Payer: Self-pay | Admitting: Surgery

## 2011-11-15 DIAGNOSIS — I10 Essential (primary) hypertension: Secondary | ICD-10-CM

## 2011-11-15 DIAGNOSIS — Z6841 Body Mass Index (BMI) 40.0 and over, adult: Secondary | ICD-10-CM

## 2011-11-15 NOTE — Progress Notes (Signed)
Re:   Joshua [name] DOB:   [birth year] MRN:   [id number]  ASSESSMENT AND PLAN: 1.  Morbid obesity - Weight - 317, BMI - 43.1  Per the 1991 NIH Consensus Statement, the patient is a candidate for bariatric surgery.  The patient attended our information session and reviewed the different types of bariatric surgery.    The patient is interested in the laparoscopic adjustable gastric band.  I discussed with the patient the indications and risks of lap band surgery.  The potential risks of surgery include, but are not limited to, bleeding, infection, DVT and PE, slippage and erosion of the band, open surgery, and death.  The patient understands the importance of compliance and long term follow-up with our group after surgery.  She was given literature regarding lap band surgery and encouraged to visit their web site, [email or web address], and register.  From here we'll obtain labs, x-rays, nutrition consult, and psych consult.  2.  Hypertension. 3.  Aortic stenosis.  CHF.  He was hospitalized in Dec. 2012.  He got a cath at that time..  Followed by Dr. Mendel Ryder. Will need card clearance. 4.  History of kidney stones. 5.  Sleep apnea  On CPAP 6.  Hyperlipidemia.  Chief Complaint  Patient presents with  . Bariatric Pre-op    lap band   REFERRING PHYSICIAN: Bradd Burner, PA, PA  HISTORY OF PRESENT ILLNESS: Joshua [name] is a 46 y.o. (DOB: December 16, 1965)  AA male whose primary care physician is Bradd Burner, [place], PA and comes to me today for bariatric surgery.  The patient has been overweight much of his adult life. He heard Dr. Wenda Low speak at our information session. He is interested in the lap band. He knows some friends who have had weight loss surgery, but he is unsure of the type of surgery they had.  He has tried multiple diets including: [employer], low-carb dot, a book called "The skinny and", and vigorous exercise. He was running and successfully lost done  to about 250 pounds. However, he had arthritic problems with his knees which stopped his exercise and he has gained weight back.  After hearing Dr. Daphine Deutscher speak of information session he has cut back on sugars and has lost about 10 pounds.  He has no history of stomach disease.  No history of liver disease.  No history of gall bladder disease.  No history of pancreas disease.  No history of colon disease.  His most significant past history is he was hospitalized in December 2012 for congestive heart failure. Dr. Garnette Scheuermann is his cardiologist.    Past Medical History  Diagnosis Date  . Hypertension   . Aortic stenosis   . Kidney stone     nephrolithiasis  . Hypogonadism male   . Heart murmur   . Sleep apnea   . Arthritis   . Dysrhythmia     2nd degree heart block  . CHF (congestive heart failure)   . Hyperlipidemia       Past Surgical History  Procedure Date  . Cardiac catheterization   . Cosmetic surgery     testicle      Current Outpatient Prescriptions  Medication Sig Dispense Refill  . Olmesartan-Amlodipine-HCTZ (TRIBENZOR) 40-5-25 MG TABS Take 1 tablet by mouth daily.        Marland Kitchen spironolactone (ALDACTONE) 50 MG tablet Take 1 tablet (50 mg total) by mouth daily.  30 tablet  11  . testosterone cypionate (DEPOTESTOTERONE CYPIONATE)  200 MG/ML injection Inject into the muscle every 14 (fourteen) days.       . vardenafil (LEVITRA) 20 MG tablet Take 20 mg by mouth daily as needed.            Allergies  Allergen Reactions  . Atenolol Swelling    Erectile dysfunction  . Bystolic (Nebivolol Hcl) Swelling    Leg swelling,difficulty breathing    REVIEW OF SYSTEMS: Skin:  No history of rash.  No history of abnormal moles. Infection:  No history of hepatitis or HIV.  No history of MRSA. Neurologic:  No history of stroke.  No history of seizure.  No history of headaches. Cardiac:  Hypertension x 9 years.  CHF.  Aortic stenosis.  Followed by Dr. Mendel Ryder. Pulmonary:  Sleep  apnea x 10 years.  Endocrine:  No diabetes. No thyroid disease. Gastrointestinal: See HPI. Urologic:  History of kidney stones.  Last about 2008. Musculoskeletal:  Knee arthritis, but is not seeing an ortho at this time. Hematologic:  No bleeding disorder.  No history of anemia.  Not anticoagulated. Psycho-social:  The patient is oriented.   The patient has no obvious psychologic or social impairment to understanding our conversation and plan.  SOCIAL and FAMILY HISTORY: Married. Works at [employer].  PHYSICAL EXAM: BP 137/81  Pulse 78  Temp(Src) 98 F (36.7 C) (Temporal)  Resp 18  Ht 6' (1.829 m)  Wt 317 lb 12.8 oz (144.153 kg)  BMI 43.10 kg/m2  SpO2 98%  General: AA obese M who is alert and generally healthy appearing.  HEENT: Normal. Pupils equal. Good dentition. Neck: Supple. No mass.  No thyroid mass. Lymph Nodes:  No supraclavicular or cervical nodes. Lungs: Clear to auscultation and symmetric breath sounds. Heart:  RRR. 3/6 sternal murmur. Abdomen: Soft. No mass. No tenderness. No hernia. Normal bowel sounds.  No abdominal scars. Rectal: No mass. Guaiac neg. Extremities:  Good strength and ROM  in upper and lower extremities. Neurologic:  Grossly intact to motor and sensory function. Psychiatric: Has normal mood and affect. Behavior is normal.   DATA REVIEWED: Chart data.  Ovidio Kin, MD,  [facility] [address] [place]  [address]   [place], [place]    [address] Phone:  [phone] FAX:  [phone]

## 2011-11-27 LAB — CBC WITH DIFFERENTIAL/PLATELET
Basos: 1 % (ref 0–3)
Eos: 4 % (ref 0–7)
Eosinophils Absolute: 0.2 10*3/uL (ref 0.0–0.4)
Hemoglobin: 16.4 g/dL (ref 12.6–17.7)
Immature Grans (Abs): 0 10*3/uL (ref 0.0–0.1)
Lymphs: 34 % (ref 14–46)
Monocytes: 13 % (ref 4–13)
Neutrophils Absolute: 2.2 10*3/uL (ref 1.8–7.8)
Neutrophils Relative %: 48 % (ref 40–74)
RBC: 5.68 x10E6/uL (ref 4.14–5.80)
WBC: 4.5 10*3/uL (ref 4.0–10.5)

## 2011-11-27 LAB — COMPREHENSIVE METABOLIC PANEL
ALT: 37 IU/L (ref 0–44)
Albumin/Globulin Ratio: 1.8 (ref 1.1–2.5)
Albumin: 4.4 g/dL (ref 3.5–5.5)
BUN: 22 mg/dL (ref 6–24)
Calcium: 9.4 mg/dL (ref 8.7–10.2)
Creatinine, Ser: 1.4 mg/dL — ABNORMAL HIGH (ref 0.76–1.27)
GFR calc Af Amer: 71 mL/min/{1.73_m2} (ref >59)
GFR calc non Af Amer: 61 mL/min/{1.73_m2} (ref >59)
Glucose: 99 mg/dL (ref 65–99)
Potassium: 4.5 mmol/L (ref 3.5–5.2)
Total Protein: 6.9 g/dL (ref 6.0–8.5)

## 2011-11-27 LAB — LIPID PANEL
Chol/HDL Ratio: 3.6 ratio units (ref 0.0–5.0)
HDL: 40 mg/dL (ref >39)
Triglycerides: 66 mg/dL (ref 0–149)
VLDL Cholesterol Cal: 13 mg/dL (ref 5–40)

## 2011-11-28 ENCOUNTER — Other Ambulatory Visit: Payer: Self-pay

## 2011-11-28 ENCOUNTER — Ambulatory Visit (HOSPITAL_COMMUNITY)
Admission: RE | Admit: 2011-11-28 | Discharge: 2011-11-28 | Disposition: A | Discharge status: Home / Self Care | Payer: BC Managed Care – PPO | Source: Ambulatory Visit | Attending: Surgery | Admitting: Surgery

## 2011-11-28 DIAGNOSIS — I10 Essential (primary) hypertension: Secondary | ICD-10-CM | POA: Insufficient documentation

## 2011-11-28 DIAGNOSIS — E785 Hyperlipidemia, unspecified: Secondary | ICD-10-CM | POA: Insufficient documentation

## 2011-11-28 DIAGNOSIS — I509 Heart failure, unspecified: Secondary | ICD-10-CM | POA: Insufficient documentation

## 2011-11-28 DIAGNOSIS — Z6841 Body Mass Index (BMI) 40.0 and over, adult: Secondary | ICD-10-CM | POA: Insufficient documentation

## 2011-11-28 DIAGNOSIS — K449 Diaphragmatic hernia without obstruction or gangrene: Secondary | ICD-10-CM | POA: Insufficient documentation

## 2011-11-28 DIAGNOSIS — G4730 Sleep apnea, unspecified: Secondary | ICD-10-CM | POA: Insufficient documentation

## 2011-11-28 DIAGNOSIS — I359 Nonrheumatic aortic valve disorder, unspecified: Secondary | ICD-10-CM | POA: Insufficient documentation

## 2011-12-05 ENCOUNTER — Encounter (HOSPITAL_COMMUNITY)
Admission: RE | Disposition: A | Discharge status: Home / Self Care | Payer: Self-pay | Source: Ambulatory Visit | Attending: Surgery

## 2011-12-05 ENCOUNTER — Ambulatory Visit (HOSPITAL_COMMUNITY)
Admission: RE | Admit: 2011-12-05 | Discharge: 2011-12-05 | Disposition: A | Discharge status: Home / Self Care | Payer: BC Managed Care – PPO | Source: Ambulatory Visit | Attending: Surgery | Admitting: Surgery

## 2011-12-05 DIAGNOSIS — Z01818 Encounter for other preprocedural examination: Secondary | ICD-10-CM | POA: Insufficient documentation

## 2011-12-05 DIAGNOSIS — E669 Obesity, unspecified: Secondary | ICD-10-CM | POA: Insufficient documentation

## 2011-12-05 HISTORY — PX: BREATH TEK H PYLORI: SHX5422

## 2011-12-05 SURGERY — BREATH TEST, FOR HELICOBACTER PYLORI

## 2011-12-06 ENCOUNTER — Encounter (HOSPITAL_COMMUNITY): Payer: Self-pay | Admitting: Surgery

## 2011-12-23 ENCOUNTER — Encounter: Payer: BC Managed Care – PPO | Attending: Surgery | Admitting: *Deleted

## 2011-12-23 ENCOUNTER — Encounter: Payer: Self-pay | Admitting: *Deleted

## 2011-12-23 NOTE — Patient Instructions (Addendum)
   Follow Pre-Op Nutrition Goals to prepare for Lap Band Surgery.   Call the [facility] at [phone] once you have been given your surgery date to enrolled in the Pre-Op Nutrition Class. You will need to attend this nutrition class 3-4 weeks prior to your surgery.  

## 2011-12-23 NOTE — Progress Notes (Signed)
  Pre-Op Assessment Visit:  Pre-Operative LAGB Surgery  Medical Nutrition Therapy:  Appt start time: 0915   End time:  1015.  Patient was seen on 12/23/2011 for Pre-Operative LAGB Nutrition Assessment. Assessment and letter of approval faxed to [facility] Bariatric Surgery Program coordinator on 12/23/2011.  Approval letter sent to [facility] and will be available in the chart under the media tab.  Handouts given during visit include:  Pre-Op Goals     Patient to call for Pre-Op and Post-Op Nutrition Education at the [facility] when surgery is scheduled.

## 2012-01-26 ENCOUNTER — Other Ambulatory Visit (INDEPENDENT_AMBULATORY_CARE_PROVIDER_SITE_OTHER): Payer: Self-pay | Admitting: Surgery

## 2012-06-18 ENCOUNTER — Other Ambulatory Visit (INDEPENDENT_AMBULATORY_CARE_PROVIDER_SITE_OTHER): Payer: Self-pay | Admitting: Surgery

## 2012-06-27 ENCOUNTER — Ambulatory Visit (INDEPENDENT_AMBULATORY_CARE_PROVIDER_SITE_OTHER): Payer: BC Managed Care – PPO | Admitting: Surgery

## 2012-06-27 ENCOUNTER — Encounter: Payer: Self-pay | Admitting: *Deleted

## 2012-06-27 ENCOUNTER — Encounter: Payer: BC Managed Care – PPO | Attending: Surgery | Admitting: *Deleted

## 2012-06-27 ENCOUNTER — Encounter (INDEPENDENT_AMBULATORY_CARE_PROVIDER_SITE_OTHER): Payer: Self-pay | Admitting: Surgery

## 2012-06-27 DIAGNOSIS — I10 Essential (primary) hypertension: Secondary | ICD-10-CM

## 2012-06-27 DIAGNOSIS — Z6841 Body Mass Index (BMI) 40.0 and over, adult: Secondary | ICD-10-CM

## 2012-06-27 DIAGNOSIS — Z01818 Encounter for other preprocedural examination: Secondary | ICD-10-CM | POA: Insufficient documentation

## 2012-06-27 DIAGNOSIS — Z713 Dietary counseling and surveillance: Secondary | ICD-10-CM | POA: Insufficient documentation

## 2012-06-27 NOTE — Patient Instructions (Signed)
Follow:   Pre-Op Diet per MD 2 weeks prior to surgery  Phase 2- Liquids (clear/full) 2 weeks after surgery  Vitamin/Mineral/Calcium guidelines for purchasing bariatric supplements  Exercise guidelines pre and post-op per MD  Follow-up at [facility] in 2 weeks post-op for diet advancement. Contact Nataley Bahri as needed with questions/concerns. 

## 2012-06-27 NOTE — Progress Notes (Signed)
Bariatric Class:  Appt start time: 0830 end time:  0930.  Pre-Operative Nutrition Class  Patient was seen on 06/27/2012 for Pre-Operative Bariatric Surgery Education at the [facility].   Surgery date: 07/09/12 Surgery type: LAGB Start weight at [facility]: 305.0 lbs (12/23/11) Goal weight: None; States "just to be at a healthier weight"  Weight today: 290.0 lbs Weight change: 15.0 lbs Total weight lost: 15.0 lbs  TANITA  BODY COMP RESULTS  12/23/11 06/27/12   BMI (kg/m^2) 38.1 36.2   Fat Mass (lbs) -- 106.5   Fat Free Mass (lbs) -- 183.5   Total Body Water (lbs) -- 134.5   Samples given per MNT protocol: Bariatric Advantage Multivitamin  Lot # [id number]; Exp: 06/15   Celebrate Vitamins Multivitamin  Lot # [id number]; Exp: 07/14   Celebrate Vitamins Calcium Citrate  Lot # [id number]; Exp: 09/15   Celebrate Sublingual B12  Lot # [id number]; Exp: 05/15   Opurity Band-Optimized MVI  Lot # [id number]; Exp: 02/14   Unjury Protein powder  Lot # [id number]; Exp: 02/15   Premier Protein shake  Lot # [id number]; Exp: 04/19/13  The following the learning objective met by the patient during this course:  Identifies Pre-Op Dietary Goals and will begin 2 weeks pre-operatively  Identifies appropriate sources of fluids and proteins   States protein recommendations and appropriate sources pre and post-operatively  Identifies Post-Operative Dietary Goals and will follow for 2 weeks post-operatively  Identifies appropriate multivitamin and calcium sources  Describes the need for physical activity post-operatively and will follow MD recommendations  States when to call healthcare provider regarding medication questions or post-operative complications  Handouts given during class include:  Pre-Op Bariatric Surgery Diet Handout  Protein Shake Handout  Post-Op Bariatric Surgery Nutrition Handout  BELT Program Information Flyer  Support Group Information Flyer  [facility]  Pharmacy Bariatric Supplements Price List  Follow-Up Plan: Patient will follow-up at [facility] 2 weeks post operatively for diet advancement per MD.

## 2012-06-27 NOTE — Progress Notes (Signed)
 Re:   Joshua [name] DOB:   09/28/1965 MRN:   [id number]  ASSESSMENT AND PLAN: 1.  Morbid obesity - Weight - 317, BMI - 43.1  Per the 1991 NIH Consensus Statement, the patient is a candidate for bariatric surgery.  The patient attended our information session and reviewed the different types of bariatric surgery.    The patient is interested in the laparoscopic adjustable gastric band.  I discussed with the patient the indications and risks of lap band surgery.  The potential risks of surgery include, but are not limited to, bleeding, infection, DVT and PE, slippage and erosion of the band, open surgery, and death.  The patient understands the importance of compliance and long term follow-up with our group after surgery.  He has done a very good job of losing weight in preparation for the surgery.  His lap band is scheduled 07/09/2012.   2.  Hypertension. 3.  Aortic stenosis.  CHF.  He was hospitalized in Dec. 2012.  He got a cath at that time..  Clearance letter from Dr. Smith 11/16/2011 4.  History of kidney stones. 5.  Sleep apnea  On CPAP 6.  Hyperlipidemia. 7.  Getting testosterone shots q 2 weeks per Dr. Tannenbaum 8.  Possible small hiatal hernia - will plan to possibly repair this at the time of surgery.  I explained this to the patient.  Chief Complaint  Patient presents with  . Bariatric Pre-op   REFERRING PHYSICIAN: GURLEY,CHRISTIAN SCOTT, PA  HISTORY OF PRESENT ILLNESS: Joshua [name] is a 47 y.o. (DOB: 12/26/1965)  AA male whose primary care physician is GURLEY,CHRISTIAN SCOTT, PA and comes to me today for pre op visit.  He has an exercise routine of walking, stationary bike, and sit ups.  He asked questions about exercise.  He just met with Sara, so he has a good idea about food and diet post op.  He'll bring his mask for CPAP to the hospital.  H. Pylori - 12/06/2011 - negative. UGI - 11/15/2011 - tertiary contractions with small hiatal hernia US - 11/15/2011 -  negative. TSH - 11/27/2011 - 2.32  Weight history: The patient has been overweight much of his adult life. He heard Dr. Matt Martin speak at our information session. He is interested in the lap band. He knows some friends who have had weight loss surgery, but he is unsure of the type of surgery they had.  He has tried multiple diets including: [place] diet, low-carb dot, a book called "The skinny and", and vigorous exercise. He was running and successfully lost done to about 250 pounds. However, he had arthritic problems with his knees which stopped his exercise and he has gained weight back.  After hearing Dr. Martin speak of information session he has cut back on sugars and has lost about 10 pounds.  He has no history of stomach disease.  No history of liver disease.  No history of gall bladder disease.  No history of pancreas disease.  No history of colon disease.  His most significant past history is he was hospitalized in December 2012 for congestive heart failure. Dr. Hank Smith is his cardiologist.  Past Medical History  Diagnosis Date  . Hypertension   . Aortic stenosis   . Kidney stone     nephrolithiasis  . Hypogonadism male   . Heart murmur   . Sleep apnea   . Arthritis   . Dysrhythmia     2nd degree heart block  .   CHF (congestive heart failure)   . Hyperlipidemia       Past Surgical History  Procedure Date  . Cardiac catheterization   . Cosmetic surgery     testicle  . Breath tek h pylori 12/05/2011    Procedure: BREATH TEK H PYLORI;  Surgeon: Kashus Karlen H Olegario Emberson, MD;  Location: [facility];  Service: General;  Laterality: N/A;    Current Outpatient Prescriptions  Medication Sig Dispense Refill  . Olmesartan-Amlodipine-HCTZ (TRIBENZOR) 40-5-25 MG TABS Take 1 tablet by mouth daily.        . testosterone cypionate (DEPOTESTOTERONE CYPIONATE) 200 MG/ML injection Inject into the muscle every 14 (fourteen) days.       . vardenafil (LEVITRA) 20 MG tablet Take 20 mg by  mouth daily as needed.        . spironolactone (ALDACTONE) 50 MG tablet Take 1 tablet (50 mg total) by mouth daily.  30 tablet  11     Allergies  Allergen Reactions  . Atenolol Swelling    Erectile dysfunction  . Bystolic (Nebivolol Hcl) Swelling    Leg swelling,difficulty breathing    REVIEW OF SYSTEMS: Skin:  No history of rash.  No history of abnormal moles. Infection:  No history of hepatitis or HIV.  No history of MRSA. Neurologic:  No history of stroke.  No history of seizure.  No history of headaches. Cardiac:  Hypertension x 9 years.  CHF.  Aortic stenosis.  Followed by Dr. H. Smith. Pulmonary:  Sleep apnea x 10 years.  Endocrine:  No diabetes. No thyroid disease. Gastrointestinal: See HPI. Urologic:  History of kidney stones.  Last about 2008. Musculoskeletal:  Knee arthritis, but is not seeing an ortho at this time. Hematologic:  No bleeding disorder.  No history of anemia.  Not anticoagulated. Psycho-social:  The patient is oriented.   Saw Dr. Leury 01/16/2012  SOCIAL and FAMILY HISTORY: Married. 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  PHYSICAL EXAM: BP 136/82  Pulse 68  Temp 98 F (36.7 C)  Resp 18  Ht 6' 3" (1.905 m)  Wt 292 lb (132.45 kg)  BMI 36.50 kg/m2  General: AA obese M who is alert and generally healthy appearing.  HEENT: Normal. Pupils equal. Good dentition. Neck: Supple. No mass.  No thyroid mass. Lymph Nodes:  No supraclavicular or cervical nodes. Lungs: Clear to auscultation and symmetric breath sounds. Heart:  RRR. 3/6 sternal murmur.  Abdomen: Soft. No mass. No tenderness. No hernia. Normal bowel sounds.  No abdominal scars.  He does have a small pannus.  He is slightly more apple than pear. Rectal: No mass. Guaiac neg. Extremities:  Good strength and ROM  in upper and lower extremities. Neurologic:  Grossly intact to motor and sensory function. Psychiatric: Has normal mood and affect. Behavior is normal.   DATA  REVIEWED: Chart data.  Lesly Joslyn, MD,  [facility] [address]  [address]   [place], [place]    [address] Phone:  [phone] FAX:  [phone]  

## 2012-06-28 ENCOUNTER — Encounter (HOSPITAL_COMMUNITY): Payer: Self-pay

## 2012-06-28 ENCOUNTER — Ambulatory Visit (HOSPITAL_COMMUNITY)
Admission: RE | Admit: 2012-06-28 | Discharge: 2012-06-28 | Disposition: A | Discharge status: Home / Self Care | Payer: BC Managed Care – PPO | Source: Ambulatory Visit | Attending: Surgery | Admitting: Surgery

## 2012-06-28 ENCOUNTER — Encounter (HOSPITAL_COMMUNITY): Payer: Self-pay | Admitting: Pharmacy Technician

## 2012-06-28 ENCOUNTER — Encounter (HOSPITAL_COMMUNITY)
Admission: RE | Admit: 2012-06-28 | Discharge: 2012-06-28 | Disposition: A | Discharge status: Home / Self Care | Payer: BC Managed Care – PPO | Source: Ambulatory Visit | Attending: Surgery | Admitting: Surgery

## 2012-06-28 DIAGNOSIS — I10 Essential (primary) hypertension: Secondary | ICD-10-CM | POA: Insufficient documentation

## 2012-06-28 DIAGNOSIS — Z01818 Encounter for other preprocedural examination: Secondary | ICD-10-CM | POA: Insufficient documentation

## 2012-06-28 DIAGNOSIS — I509 Heart failure, unspecified: Secondary | ICD-10-CM | POA: Insufficient documentation

## 2012-06-28 DIAGNOSIS — G4730 Sleep apnea, unspecified: Secondary | ICD-10-CM | POA: Insufficient documentation

## 2012-06-28 HISTORY — DX: Personal history of other diseases of the digestive system: Z87.19

## 2012-06-28 LAB — BASIC METABOLIC PANEL
BUN: 31 mg/dL — ABNORMAL HIGH (ref 6–23)
CO2: 25 mEq/L (ref 19–32)
Calcium: 9.6 mg/dL (ref 8.4–10.5)
Chloride: 99 mEq/L (ref 96–112)
Creatinine, Ser: 1.37 mg/dL — ABNORMAL HIGH (ref 0.50–1.35)
Glucose, Bld: 80 mg/dL (ref 70–99)

## 2012-06-28 LAB — CBC
HCT: 48.5 % (ref 39.0–52.0)
MCH: 28.5 pg (ref 26.0–34.0)
MCV: 83.9 fL (ref 78.0–100.0)
Platelets: 272 10*3/uL (ref 150–400)
RBC: 5.78 MIL/uL (ref 4.22–5.81)
WBC: 3.6 10*3/uL — ABNORMAL LOW (ref 4.0–10.5)

## 2012-06-28 MED ORDER — CHLORHEXIDINE GLUCONATE 4 % EX LIQD: 1.0000 "application " | Freq: Once | CUTANEOUS | Status: DC

## 2012-06-28 NOTE — Pre-Procedure Instructions (Signed)
PREOP CBC, BMET, CXR WERE DONE TODAY AT [facility] AS PER ANESTHESIOLOGIST'S GUIDELINES. PT HAS EKG REPORT IN EPIC FROM 11/28/11, CARDIOLOGY OFFICE NOTE DR. Mendel Ryder 03/14/12 WITH OFFICE EKG 06/18/11 AND ECHO REPORT 2011 AND BIPAP TITRATION STUDY REPORT 08/12/11.

## 2012-06-28 NOTE — Progress Notes (Signed)
Dr. Ezzard Standing,       Joshua [name]' preop lab results in Epic for your review--his BUN 31, Creat 1.37.

## 2012-06-28 NOTE — Patient Instructions (Signed)
YOUR SURGERY IS SCHEDULED AT [facility]  ON:   Tuesday 1/28  REPORT TO [facility] AT: 5:15 AM      PHONE # FOR SHORT STAY IS [phone]  DO NOT EAT OR DRINK ANYTHING AFTER MIDNIGHT THE NIGHT BEFORE YOUR SURGERY.  YOU MAY BRUSH YOUR TEETH, RINSE OUT YOUR MOUTH--BUT NO WATER, NO FOOD, NO CHEWING GUM, NO MINTS, NO CANDIES, NO CHEWING TOBACCO.  PLEASE TAKE THE FOLLOWING MEDICATIONS THE AM OF YOUR SURGERY WITH A FEW SIPS OF WATER:  NO MEDS TO TAKE  IF YOU USE INHALERS--USE YOUR INHALERS THE AM OF YOUR SURGERY AND BRING INHALERS TO THE HOSPITAL -TAKE TO SURGERY.    IF YOU ARE DIABETIC:  DO NOT TAKE ANY DIABETIC MEDICATIONS THE AM OF YOUR SURGERY.  IF YOU TAKE INSULIN IN THE EVENINGS--PLEASE ONLY TAKE 1/2 NORMAL EVENING DOSE THE NIGHT BEFORE YOUR SURGERY.  NO INSULIN THE AM OF YOUR SURGERY.  IF YOU HAVE SLEEP APNEA AND USE CPAP OR BIPAP--PLEASE BRING THE MASK AND THE TUBING.  DO NOT BRING YOUR MACHINE.  DO NOT BRING VALUABLES, MONEY, CREDIT CARDS.  DO NOT WEAR JEWELRY, MAKE-UP, NAIL POLISH AND NO METAL PINS OR CLIPS IN YOUR HAIR. CONTACT LENS, DENTURES / PARTIALS, GLASSES SHOULD NOT BE WORN TO SURGERY AND IN MOST CASES-HEARING AIDS WILL NEED TO BE REMOVED.  BRING YOUR GLASSES CASE, ANY EQUIPMENT NEEDED FOR YOUR CONTACT LENS. FOR PATIENTS ADMITTED TO THE HOSPITAL--CHECK OUT TIME THE DAY OF DISCHARGE IS 11:00 AM.  ALL INPATIENT ROOMS ARE PRIVATE - WITH BATHROOM, TELEPHONE, TELEVISION AND WIFI INTERNET.  IF YOU ARE BEING DISCHARGED THE SAME DAY OF YOUR SURGERY--YOU CAN NOT DRIVE YOURSELF HOME--AND SHOULD NOT GO HOME ALONE BY TAXI OR BUS.  NO DRIVING OR OPERATING MACHINERY FOR 24 HOURS FOLLOWING ANESTHESIA / PAIN MEDICATIONS.  PLEASE MAKE ARRANGEMENTS FOR SOMEONE TO BE WITH YOU AT HOME THE FIRST 24 HOURS AFTER SURGERY. RESPONSIBLE DRIVER'S NAME___________________________                                               PHONE #   _______________________           PLEASE READ OVER ANY  FACT SHEETS THAT YOU WERE GIVEN: MRSA INFORMATION, BLOOD TRANSFUSION INFORMATION, INCENTIVE SPIROMETER INFORMATION. FAILURE TO FOLLOW THESE INSTRUCTIONS MAY RESULT IN THE CANCELLATION OF YOUR SURGERY.   PATIENT SIGNATURE_________________________________

## 2012-07-09 ENCOUNTER — Observation Stay (HOSPITAL_COMMUNITY): Payer: BC Managed Care – PPO

## 2012-07-09 ENCOUNTER — Encounter (HOSPITAL_COMMUNITY): Payer: Self-pay | Admitting: Anesthesiology

## 2012-07-09 ENCOUNTER — Encounter (HOSPITAL_COMMUNITY): Payer: Self-pay | Admitting: *Deleted

## 2012-07-09 ENCOUNTER — Ambulatory Visit (HOSPITAL_COMMUNITY): Payer: BC Managed Care – PPO | Admitting: Anesthesiology

## 2012-07-09 ENCOUNTER — Encounter (HOSPITAL_COMMUNITY)
Admission: RE | Disposition: A | Discharge status: Home / Self Care | Payer: Self-pay | Source: Ambulatory Visit | Attending: Surgery

## 2012-07-09 ENCOUNTER — Observation Stay (HOSPITAL_COMMUNITY)
Admission: RE | Admit: 2012-07-09 | Discharge: 2012-07-10 | Disposition: A | Discharge status: Home / Self Care | Payer: BC Managed Care – PPO | Source: Ambulatory Visit | Attending: Surgery | Admitting: Surgery

## 2012-07-09 DIAGNOSIS — I10 Essential (primary) hypertension: Secondary | ICD-10-CM

## 2012-07-09 DIAGNOSIS — G4730 Sleep apnea, unspecified: Secondary | ICD-10-CM | POA: Insufficient documentation

## 2012-07-09 DIAGNOSIS — G4733 Obstructive sleep apnea (adult) (pediatric): Secondary | ICD-10-CM

## 2012-07-09 DIAGNOSIS — K449 Diaphragmatic hernia without obstruction or gangrene: Secondary | ICD-10-CM | POA: Insufficient documentation

## 2012-07-09 DIAGNOSIS — E785 Hyperlipidemia, unspecified: Secondary | ICD-10-CM | POA: Insufficient documentation

## 2012-07-09 DIAGNOSIS — Z79899 Other long term (current) drug therapy: Secondary | ICD-10-CM | POA: Insufficient documentation

## 2012-07-09 DIAGNOSIS — I509 Heart failure, unspecified: Secondary | ICD-10-CM | POA: Insufficient documentation

## 2012-07-09 DIAGNOSIS — Z6841 Body Mass Index (BMI) 40.0 and over, adult: Secondary | ICD-10-CM | POA: Insufficient documentation

## 2012-07-09 HISTORY — PX: HIATAL HERNIA REPAIR: SHX195

## 2012-07-09 HISTORY — PX: LAPAROSCOPIC GASTRIC BANDING: SHX1100

## 2012-07-09 HISTORY — PX: MESH APPLIED TO LAP PORT: SHX5969

## 2012-07-09 SURGERY — GASTRIC BANDING, LAPAROSCOPIC, WITH HIATAL HERNIA REPAIR
Anesthesia: General | Site: Abdomen | Wound class: Clean

## 2012-07-09 MED ORDER — PROPOFOL 10 MG/ML IV BOLUS
INTRAVENOUS | Status: DC | PRN
  Administered 2012-07-09: 200 mg via INTRAVENOUS

## 2012-07-09 MED ORDER — ROCURONIUM BROMIDE 100 MG/10ML IV SOLN
INTRAVENOUS | Status: DC | PRN
  Administered 2012-07-09: 2 mg via INTRAVENOUS
  Administered 2012-07-09: 10 mg via INTRAVENOUS
  Administered 2012-07-09: 48 mg via INTRAVENOUS

## 2012-07-09 MED ORDER — LACTATED RINGERS IV SOLN
INTRAVENOUS | Status: DC | PRN
  Administered 2012-07-09: 1000 mL via INTRAVENOUS

## 2012-07-09 MED ORDER — DEXTROSE 5 % IV SOLN
3.0000 g | INTRAVENOUS | Status: AC
  Administered 2012-07-09: 3 g via INTRAVENOUS

## 2012-07-09 MED ORDER — OXYCODONE HCL 5 MG/5ML PO SOLN: 5.0000 mg | Freq: Once | ORAL | Status: DC | PRN

## 2012-07-09 MED ORDER — MIDAZOLAM HCL 5 MG/5ML IJ SOLN
INTRAMUSCULAR | Status: DC | PRN
  Administered 2012-07-09: 2 mg via INTRAVENOUS

## 2012-07-09 MED ORDER — AMLODIPINE BESYLATE 5 MG PO TABS
5.0000 mg | ORAL_TABLET | Freq: Once | ORAL | Status: AC
  Administered 2012-07-09: 5 mg via ORAL
  Filled 2012-07-09: qty 1

## 2012-07-09 MED ORDER — SODIUM CHLORIDE 0.9 % IJ SOLN
INTRAMUSCULAR | Status: DC | PRN
  Administered 2012-07-09: 20 mL

## 2012-07-09 MED ORDER — NEOSTIGMINE METHYLSULFATE 1 MG/ML IJ SOLN
INTRAMUSCULAR | Status: DC | PRN
  Administered 2012-07-09: 4 mg via INTRAVENOUS

## 2012-07-09 MED ORDER — OXYCODONE HCL 5 MG PO TABS: 5.0000 mg | ORAL_TABLET | Freq: Once | ORAL | Status: DC | PRN

## 2012-07-09 MED ORDER — PHENYLEPHRINE HCL 10 MG/ML IJ SOLN
INTRAMUSCULAR | Status: DC | PRN
  Administered 2012-07-09 (×4): 80 ug via INTRAVENOUS

## 2012-07-09 MED ORDER — UNJURY VANILLA POWDER
2.0000 [oz_av] | Freq: Four times a day (QID) | ORAL | Status: DC
  Administered 2012-07-10: 2 [oz_av] via ORAL

## 2012-07-09 MED ORDER — LACTATED RINGERS IV SOLN
INTRAVENOUS | Status: DC | PRN
  Administered 2012-07-09 (×2): via INTRAVENOUS

## 2012-07-09 MED ORDER — ONDANSETRON HCL 4 MG/2ML IJ SOLN
4.0000 mg | INTRAMUSCULAR | Status: DC | PRN
  Administered 2012-07-09: 4 mg via INTRAVENOUS
  Filled 2012-07-09: qty 2

## 2012-07-09 MED ORDER — HEPARIN SODIUM (PORCINE) 5000 UNIT/ML IJ SOLN
5000.0000 [IU] | Freq: Three times a day (TID) | INTRAMUSCULAR | Status: DC
  Administered 2012-07-09 – 2012-07-10 (×3): 5000 [IU] via SUBCUTANEOUS
  Filled 2012-07-09 (×7): qty 1

## 2012-07-09 MED ORDER — 0.9 % SODIUM CHLORIDE (POUR BTL) OPTIME
TOPICAL | Status: DC | PRN
  Administered 2012-07-09: 1000 mL

## 2012-07-09 MED ORDER — HYDROMORPHONE HCL PF 1 MG/ML IJ SOLN
0.2500 mg | INTRAMUSCULAR | Status: DC | PRN
  Administered 2012-07-09 (×2): 0.5 mg via INTRAVENOUS

## 2012-07-09 MED ORDER — ONDANSETRON HCL 4 MG/2ML IJ SOLN
INTRAMUSCULAR | Status: DC | PRN
  Administered 2012-07-09: 4 mg via INTRAVENOUS

## 2012-07-09 MED ORDER — MORPHINE SULFATE 2 MG/ML IJ SOLN
2.0000 mg | INTRAMUSCULAR | Status: DC | PRN
Start: 2012-07-09 — End: 2012-07-10
  Administered 2012-07-09 (×2): 4 mg via INTRAVENOUS
  Filled 2012-07-09 (×2): qty 2

## 2012-07-09 MED ORDER — ACETAMINOPHEN 10 MG/ML IV SOLN
INTRAVENOUS | Status: DC | PRN
  Administered 2012-07-09: 1000 mg via INTRAVENOUS

## 2012-07-09 MED ORDER — UNJURY CHICKEN SOUP POWDER: 2.0000 [oz_av] | Freq: Four times a day (QID) | ORAL | Status: DC

## 2012-07-09 MED ORDER — PROMETHAZINE HCL 25 MG/ML IJ SOLN: 6.2500 mg | INTRAMUSCULAR | Status: DC | PRN

## 2012-07-09 MED ORDER — UNJURY CHOCOLATE CLASSIC POWDER: 2.0000 [oz_av] | Freq: Four times a day (QID) | ORAL | Status: DC

## 2012-07-09 MED ORDER — HEPARIN SODIUM (PORCINE) 5000 UNIT/ML IJ SOLN
5000.0000 [IU] | Freq: Once | INTRAMUSCULAR | Status: AC
  Administered 2012-07-09: 5000 [IU] via SUBCUTANEOUS
  Filled 2012-07-09: qty 1

## 2012-07-09 MED ORDER — POTASSIUM CHLORIDE IN NACL 20-0.45 MEQ/L-% IV SOLN
INTRAVENOUS | Status: DC
  Administered 2012-07-09 – 2012-07-10 (×3): via INTRAVENOUS
  Filled 2012-07-09 (×3): qty 1000

## 2012-07-09 MED ORDER — ACETAMINOPHEN 160 MG/5ML PO SOLN: 650.0000 mg | ORAL | Status: DC | PRN

## 2012-07-09 MED ORDER — GLYCOPYRROLATE 0.2 MG/ML IJ SOLN
INTRAMUSCULAR | Status: DC | PRN
  Administered 2012-07-09: 0.6 mg via INTRAVENOUS

## 2012-07-09 MED ORDER — SUCCINYLCHOLINE CHLORIDE 20 MG/ML IJ SOLN
INTRAMUSCULAR | Status: DC | PRN
  Administered 2012-07-09: 160 mg via INTRAVENOUS

## 2012-07-09 MED ORDER — OXYCODONE-ACETAMINOPHEN 5-325 MG/5ML PO SOLN: 5.0000 mL | ORAL | Status: DC | PRN

## 2012-07-09 MED ORDER — MEPERIDINE HCL 50 MG/ML IJ SOLN: 6.2500 mg | INTRAMUSCULAR | Status: DC | PRN

## 2012-07-09 MED ORDER — FENTANYL CITRATE 0.05 MG/ML IJ SOLN
INTRAMUSCULAR | Status: DC | PRN
  Administered 2012-07-09: 100 ug via INTRAVENOUS
  Administered 2012-07-09 (×3): 50 ug via INTRAVENOUS

## 2012-07-09 MED ORDER — ACETAMINOPHEN 10 MG/ML IV SOLN
1000.0000 mg | Freq: Four times a day (QID) | INTRAVENOUS | Status: AC
  Administered 2012-07-09 – 2012-07-10 (×4): 1000 mg via INTRAVENOUS
  Filled 2012-07-09 (×7): qty 100

## 2012-07-09 MED ORDER — ACETAMINOPHEN 10 MG/ML IV SOLN: 1000.0000 mg | Freq: Once | INTRAVENOUS | Status: DC | PRN

## 2012-07-09 MED ORDER — BUPIVACAINE-EPINEPHRINE 0.25% -1:200000 IJ SOLN
INTRAMUSCULAR | Status: DC | PRN
  Administered 2012-07-09: 50 mL

## 2012-07-09 SURGICAL SUPPLY — 66 items
ADH SKN CLS APL DERMABOND .7 (GAUZE/BANDAGES/DRESSINGS) ×2
APL SKNCLS STERI-STRIP NONHPOA (GAUZE/BANDAGES/DRESSINGS)
BAND LAP 10.0 W/TUBES (Band) ×3 IMPLANT
BENZOIN TINCTURE PRP APPL 2/3 (GAUZE/BANDAGES/DRESSINGS) IMPLANT
BLADE HEX COATED 2.75 (ELECTRODE) ×3 IMPLANT
BLADE SURG 15 STRL LF DISP TIS (BLADE) ×2 IMPLANT
BLADE SURG 15 STRL SS (BLADE) ×3
CANISTER SUCTION 2500CC (MISCELLANEOUS) ×3 IMPLANT
CLOTH BEACON ORANGE TIMEOUT ST (SAFETY) ×3 IMPLANT
COVER SURGICAL LIGHT HANDLE (MISCELLANEOUS) ×3 IMPLANT
DECANTER SPIKE VIAL GLASS SM (MISCELLANEOUS) ×6 IMPLANT
DERMABOND ADVANCED (GAUZE/BANDAGES/DRESSINGS) ×1
DERMABOND ADVANCED .7 DNX12 (GAUZE/BANDAGES/DRESSINGS) IMPLANT
DEVICE SUT QUICK LOAD TK 5 (STAPLE) ×10 IMPLANT
DEVICE SUT TI-KNOT TK 5X26 (MISCELLANEOUS) ×3 IMPLANT
DEVICE SUTURE ENDOST 10MM (ENDOMECHANICALS) ×1 IMPLANT
DISSECTOR BLUNT TIP ENDO 5MM (MISCELLANEOUS) IMPLANT
DRAPE CAMERA CLOSED 9X96 (DRAPES) ×3 IMPLANT
ELECT REM PT RETURN 9FT ADLT (ELECTROSURGICAL) ×3
ELECTRODE REM PT RTRN 9FT ADLT (ELECTROSURGICAL) ×2 IMPLANT
GAUZE SPONGE 4X4 16PLY XRAY LF (GAUZE/BANDAGES/DRESSINGS) ×1 IMPLANT
GLOVE BIOGEL PI IND STRL 7.0 (GLOVE) ×2 IMPLANT
GLOVE BIOGEL PI INDICATOR 7.0 (GLOVE) ×1
GOWN STRL NON-REIN LRG LVL3 (GOWN DISPOSABLE) ×3 IMPLANT
GOWN STRL REIN XL XLG (GOWN DISPOSABLE) ×8 IMPLANT
HOVERMATT SINGLE USE (MISCELLANEOUS) ×3 IMPLANT
KIT BASIN OR (CUSTOM PROCEDURE TRAY) ×3 IMPLANT
MESH HERNIA 1X4 RECT BARD (Mesh General) IMPLANT
MESH HERNIA BARD 1X4 (Mesh General) ×1 IMPLANT
NDL SPNL 22GX3.5 QUINCKE BK (NEEDLE) ×2 IMPLANT
NEEDLE SPNL 22GX3.5 QUINCKE BK (NEEDLE) ×3 IMPLANT
NS IRRIG 1000ML POUR BTL (IV SOLUTION) ×3 IMPLANT
PACK UNIVERSAL I (CUSTOM PROCEDURE TRAY) ×3 IMPLANT
PENCIL BUTTON HOLSTER BLD 10FT (ELECTRODE) ×3 IMPLANT
SCALPEL HARMONIC ACE (MISCELLANEOUS) IMPLANT
SET IRRIG TUBING LAPAROSCOPIC (IRRIGATION / IRRIGATOR) IMPLANT
SLEEVE ADV FIXATION 5X100MM (TROCAR) ×1 IMPLANT
SLEEVE SURGEON STRL (DRAPES) ×1 IMPLANT
SLEEVE XCEL OPT CAN 5 100 (ENDOMECHANICALS) ×2 IMPLANT
SOLUTION ANTI FOG 6CC (MISCELLANEOUS) ×3 IMPLANT
SPONGE GAUZE 4X4 12PLY (GAUZE/BANDAGES/DRESSINGS) ×3 IMPLANT
SPONGE LAP 18X18 X RAY DECT (DISPOSABLE) ×3 IMPLANT
STAPLER VISISTAT 35W (STAPLE) ×3 IMPLANT
STRIP CLOSURE SKIN 1/2X4 (GAUZE/BANDAGES/DRESSINGS) IMPLANT
SUT ETHIBOND 2 0 SH (SUTURE) ×9
SUT ETHIBOND 2 0 SH 36X2 (SUTURE) ×6 IMPLANT
SUT PROLENE 2 0 CT2 30 (SUTURE) ×3 IMPLANT
SUT SILK 0 (SUTURE)
SUT SILK 0 30XBRD TIE 6 (SUTURE) IMPLANT
SUT SURGIDAC NAB ES-9 0 48 120 (SUTURE) ×1 IMPLANT
SUT VIC AB 2-0 SH 27 (SUTURE)
SUT VIC AB 2-0 SH 27X BRD (SUTURE) IMPLANT
SUT VIC AB 4-0 SH 18 (SUTURE) ×3 IMPLANT
SYR 20CC LL (SYRINGE) ×3 IMPLANT
SYR 30ML LL (SYRINGE) ×3 IMPLANT
SYS KII OPTICAL ACCESS 15MM (TROCAR) ×3
SYSTEM KII OPTICAL ACCESS 15MM (TROCAR) ×2 IMPLANT
TOWEL OR 17X26 10 PK STRL BLUE (TOWEL DISPOSABLE) ×9 IMPLANT
TROCAR ADV FIXATION 11X100MM (TROCAR) IMPLANT
TROCAR XCEL 12X100 BLDLESS (ENDOMECHANICALS) ×2 IMPLANT
TROCAR XCEL NON-BLD 11X100MML (ENDOMECHANICALS) ×3 IMPLANT
TROCAR Z-THREAD FIOS 11X100 BL (TROCAR) IMPLANT
TROCAR Z-THREAD FIOS 5X100MM (TROCAR) ×3 IMPLANT
TROCAR Z-THREAD SLEEVE 11X100 (TROCAR) IMPLANT
TUBE CALIBRATION LAPBAND (TUBING) ×3 IMPLANT
TUBING INSUFFLATION 10FT LAP (TUBING) ×3 IMPLANT

## 2012-07-09 NOTE — Transfer of Care (Signed)
Immediate Anesthesia Transfer of Care Note  Patient: Joshua [name]  Procedure(s) Performed: Procedure(s) (LRB) with comments: LAPAROSCOPIC GASTRIC BANDING WITH HIATAL HERNIA REPAIR (N/A) MESH APPLIED TO LAP PORT () LAPAROSCOPIC REPAIR OF HIATAL HERNIA ()  Patient Location: PACU  Anesthesia Type:General  Level of Consciousness: awake and patient cooperative  Airway & Oxygen Therapy: Patient Spontanous Breathing and Patient connected to face mask oxygen  Post-op Assessment: Report given to PACU RN, Post -op Vital signs reviewed and stable and Patient moving all extremities  Post vital signs: Reviewed and stable  Complications: No apparent anesthesia complications

## 2012-07-09 NOTE — H&P (View-Only) (Signed)
Re:   Joshua [name] DOB:   1966-05-13 MRN:   [id number]  ASSESSMENT AND PLAN: 1.  Morbid obesity - Weight - 317, BMI - 43.1  Per the 1991 NIH Consensus Statement, the patient is a candidate for bariatric surgery.  The patient attended our information session and reviewed the different types of bariatric surgery.    The patient is interested in the laparoscopic adjustable gastric band.  I discussed with the patient the indications and risks of lap band surgery.  The potential risks of surgery include, but are not limited to, bleeding, infection, DVT and PE, slippage and erosion of the band, open surgery, and death.  The patient understands the importance of compliance and long term follow-up with our group after surgery.  He has done a very good job of losing weight in preparation for the surgery.  His lap band is scheduled 07/09/2012.   2.  Hypertension. 3.  Aortic stenosis.  CHF.  He was hospitalized in Dec. 2012.  He got a cath at that time..  Clearance letter from Dr. Katrinka Blazing 11/16/2011 4.  History of kidney stones. 5.  Sleep apnea  On CPAP 6.  Hyperlipidemia. 7.  Getting testosterone shots q 2 weeks per Dr. Patsi Sears 8.  Possible small hiatal hernia - will plan to possibly repair this at the time of surgery.  I explained this to the patient.  Chief Complaint  Patient presents with  . Bariatric Pre-op   REFERRING PHYSICIAN: Bradd Burner, PA  HISTORY OF PRESENT ILLNESS: Joshua [name] is a 47 y.o. (DOB: 1966/03/26)  AA male whose primary care physician is Bradd Burner, [place] and comes to me today for pre op visit.  He has an exercise routine of walking, stationary bike, and sit ups.  He asked questions about exercise.  He just met with [name], so he has a good idea about food and diet post op.  He'll bring his mask for CPAP to the hospital.  H. Pylori - 12/06/2011 - negative. UGI - 11/15/2011 - tertiary contractions with small hiatal hernia [place] - 11/15/2011 -  negative. TSH - 11/27/2011 - 2.32  Weight history: The patient has been overweight much of his adult life. He heard Dr. Wenda Low speak at our information session. He is interested in the lap band. He knows some friends who have had weight loss surgery, but he is unsure of the type of surgery they had.  He has tried multiple diets including: [employer], low-carb dot, a book called "The skinny and", and vigorous exercise. He was running and successfully lost done to about 250 pounds. However, he had arthritic problems with his knees which stopped his exercise and he has gained weight back.  After hearing Dr. Daphine Deutscher speak of information session he has cut back on sugars and has lost about 10 pounds.  He has no history of stomach disease.  No history of liver disease.  No history of gall bladder disease.  No history of pancreas disease.  No history of colon disease.  His most significant past history is he was hospitalized in December 2012 for congestive heart failure. Dr. Garnette Scheuermann is his cardiologist.  Past Medical History  Diagnosis Date  . Hypertension   . Aortic stenosis   . Kidney stone     nephrolithiasis  . Hypogonadism male   . Heart murmur   . Sleep apnea   . Arthritis   . Dysrhythmia     2nd degree heart block  .  CHF (congestive heart failure)   . Hyperlipidemia       Past Surgical History  Procedure Date  . Cardiac catheterization   . Cosmetic surgery     testicle  . Breath tek h pylori 12/05/2011    Procedure: BREATH TEK H PYLORI;  Surgeon: Kandis Cocking, MD;  Location: [facility];  Service: General;  Laterality: N/A;    Current Outpatient Prescriptions  Medication Sig Dispense Refill  . Olmesartan-Amlodipine-HCTZ (TRIBENZOR) 40-5-25 MG TABS Take 1 tablet by mouth daily.        Marland Kitchen testosterone cypionate (DEPOTESTOTERONE CYPIONATE) 200 MG/ML injection Inject into the muscle every 14 (fourteen) days.       . vardenafil (LEVITRA) 20 MG tablet Take 20 mg by  mouth daily as needed.        Marland Kitchen spironolactone (ALDACTONE) 50 MG tablet Take 1 tablet (50 mg total) by mouth daily.  30 tablet  11     Allergies  Allergen Reactions  . Atenolol Swelling    Erectile dysfunction  . Bystolic (Nebivolol Hcl) Swelling    Leg swelling,difficulty breathing    REVIEW OF SYSTEMS: Skin:  No history of rash.  No history of abnormal moles. Infection:  No history of hepatitis or HIV.  No history of MRSA. Neurologic:  No history of stroke.  No history of seizure.  No history of headaches. Cardiac:  Hypertension x 9 years.  CHF.  Aortic stenosis.  Followed by Dr. Mendel Ryder. Pulmonary:  Sleep apnea x 10 years.  Endocrine:  No diabetes. No thyroid disease. Gastrointestinal: See HPI. Urologic:  History of kidney stones.  Last about 2008. Musculoskeletal:  Knee arthritis, but is not seeing an ortho at this time. Hematologic:  No bleeding disorder.  No history of anemia.  Not anticoagulated. Psycho-social:  The patient is oriented.   Saw Dr. Kimber Relic 01/16/2012  SOCIAL and FAMILY HISTORY: Married. Works at [employer].  PHYSICAL EXAM: BP 136/82  Pulse 68  Temp 98 F (36.7 C)  Resp 18  Ht 6\' 3"  (1.905 m)  Wt 292 lb (132.45 kg)  BMI 36.50 kg/m2  General: AA obese M who is alert and generally healthy appearing.  HEENT: Normal. Pupils equal. Good dentition. Neck: Supple. No mass.  No thyroid mass. Lymph Nodes:  No supraclavicular or cervical nodes. Lungs: Clear to auscultation and symmetric breath sounds. Heart:  RRR. 3/6 sternal murmur.  Abdomen: Soft. No mass. No tenderness. No hernia. Normal bowel sounds.  No abdominal scars.  He does have a small pannus.  He is slightly more apple than pear. Rectal: No mass. Guaiac neg. Extremities:  Good strength and ROM  in upper and lower extremities. Neurologic:  Grossly intact to motor and sensory function. Psychiatric: Has normal mood and affect. Behavior is normal.   DATA  REVIEWED: Chart data.  Ovidio Kin, MD,  [facility] [address]  [address]   [place], [place]    [address] Phone:  [phone] FAX:  [phone]

## 2012-07-09 NOTE — Interval H&P Note (Signed)
History and Physical Interval Note:  07/09/2012 7:04 AM  Joshua [name]  has presented today for surgery, with the diagnosis of morbid obesity  The various methods of treatment have been discussed with the patient and family.  The patient's wife is here today.  After consideration of risks, benefits and other options for treatment, the patient has consented to  Procedure(s) (LRB) with comments: LAPAROSCOPIC GASTRIC BANDING WITH HIATAL HERNIA REPAIR (N/A) - Lap Band, possible Hiatal Hernia Repair as a surgical intervention .    The patient's history has been reviewed, patient examined, no change in status, stable for surgery.  I have reviewed the patient's chart and labs.  Questions were answered to the patient's satisfaction.     Shavanna Furnari H

## 2012-07-09 NOTE — Op Note (Addendum)
07/09/2012  8:51 AM  PATIENT:  Joshua [name], 47 y.o., male, MRN: [id number]  PREOP DIAGNOSIS:  morbid obesity, small hiatal hernia  POSTOP DIAGNOSIS:   Morbid Obesity, Weight - 317, BMI - 43.1, small hiatal hernia  PROCEDURE:   Laparoscopic adjustable gastric band, AP Standard, repair of hiatal hernia with posterior crural stitch.  SURGEON:   Ovidio Kin, M.D.  ASSISTANT:   Sheron Nightingale  ANESTHESIA:   general  Gaylan Gerold, MD - Anesthesiologist Elyn Peers, CRNA - CRNA  General  EBL:  minimal  ml  BLOOD ADMINISTERED: none  LOCAL MEDICATIONS USED:   30 cc 1/4% marcaine  SPECIMEN:   None  COUNTS CORRECT:  YES  INDICATIONS FOR PROCEDURE:  Joshua [name] is a 47 y.o. (DOB: 12-16-1965) AA male whose primary care physician is Bradd Burner, [place] and comes for adjustable laparoscopic gastric band placement.  He also has a small HH on the UGI and I discussed possibly repairing this at the same time.   The indications and risks of the Lap Band surgery were explained to the patient.  The risks include, but are not limited to, infection, bleeding, bowel injury, and failure to lose weight.  Long term risk include, but are not limited to, slippage and erosion of the Lap Band.  OPERATIVE NOTE:  The patient was taken to room #1 at [facility] for the operation.  Gaylan Gerold, MD, was the supervising anesthesiologist.  The patient's abdomen was prepped with Chloroprep and sterilely draped.  The patient was given Ancef at the beginning of the operation.   A time out was held and the surgical checklist reviewed.   The abdomen was accessed in the LUQ with an 5 mm trocar.  Five additional trocars were placed: a 5 mm trocar in the subxiphoid area for the liver retractor, a 15 mm trocar in the right costal margin, a 12 mm trocar to the right of midline, a 5 mm trocar to the left of midline, and a 5 mm trocar in the left lateral subcostal area.   An abdominal exploration was carried  out.  The liver was unremarkable.  The stomach was unremarkable.  The remainder of the bowel, which could be seen, was unremarkable.   A test for a hiatal hernia was done using the sizing balloon.  The sizing tube was advanced into the stomach, 15 cc of air and then 10 cc of air was placed in the sizing balloon, and the balloon pulled back to the esophageal hiatus.  The balloon held up, so the defect was not large.  But there was an anterior dimple, suggestive of a hiatal hernia.    I then opened the gastrohepatic ligament over the caudate lobe of the liver.  I opened the tissues behind the esophagogastric junction, identified the left and right crus, and placed a single suture of 2-0 Ethibond to close the hiatus.  I retested the hiatus with the balloon and the hiatus seemed snugger.   I made an incision at the Angle of Hiss and identified the left crus.  I identified the right crus and passed the "finger" dissector behind the gastroesophageal junction.   I then used a AP Standard Lap Band and inserted the silastic tubing through the tip of the finger dissector and pulled the tubing around the proximal stomach.  The sizing tube was passed into the stomach and the lap band closed over the sizing tube.  The sizing tube was removed.   I  then imbricated the lateral stomach over the lap band and sewed it to the stomach proximal to the lap band.  I used 2-0 Ethibond sutures secured with Tye-Knots to create the gastro-gastric imbrication over the lateral Lap Band.  I placed 3 sutures. Photos were taken and placed in the chart.  The lap band and tubing lay in good position.  The tubing was brought through the right lateral trocar site and attached to the lap band reservoir.  The reservoir has a piece of polypropylene mesh sewn on the back to fixate it in the subcutaneous tissues.   I infiltrated Marcaine into each incision for a total of 30 cc.   Each incision was sewn with 5-0 Monocryl sutures and the skin was  painted with Dermabond.  The patient tolerated the procedure well and was sent to the recovery room in good condition.  The sponge and needle count were correct at the end of the case.  Ovidio Kin, MD, [facility] Pager: [phone] Office phone:  [phone]

## 2012-07-09 NOTE — Anesthesia Preprocedure Evaluation (Addendum)
Anesthesia Evaluation  Patient identified by MRN, date of birth, ID band Patient awake    Reviewed: Allergy & Precautions, H&P , NPO status , Patient's Chart, lab work & pertinent test results  Airway Mallampati: II TM Distance: >3 FB Neck ROM: Full    Dental  (+) Dental Advisory Given and Teeth Intact   Pulmonary sleep apnea ,  breath sounds clear to auscultation  Pulmonary exam normal       Cardiovascular hypertension, Pt. on medications +CHF + dysrhythmias + Valvular Problems/Murmurs AS Rhythm:Regular Rate:Normal + Systolic murmurs Echo 05/2011  Left ventricle: There was mild concentric hypertrophy.   Systolic function was mildly to moderately reduced. The   estimated ejection fraction was in the range of 40% to   45%. Diffuse hypokinesis. There is severe hypokinesis of   the basalinferoseptal myocardium; consistent with   infarction. Doppler parameters are consistent with a   reversible restrictive pattern, indicative of decreased   left ventricular diastolic compliance and/or increased   left atrial pressure (grade 3 diastolic dysfunction).   Doppler parameters are consistent with high ventricular   filling pressure. - Aortic valve: Mildly to moderately calcified annulus.   Bicuspid; severely thickened, moderately calcified   leaflets. Transvalvular velocity was increased. There was   mild stenosis. Mild to moderate regurgitation. Valve area:   1.55cm^2(VTI). Valve area: 1.55cm^2 (Vmax). - Mitral valve: Mild regurgitation. Valve area by pressure   half-time: 2.22cm^2. Valve area by continuity equation   (using LVOT flow): 2.34cm^2. - Atrial septum: No defect or patent foramen ovale was   identified.    Neuro/Psych negative neurological ROS  negative psych ROS   GI/Hepatic Neg liver ROS, hiatal hernia,   Endo/Other  negative endocrine ROS  Renal/GU Renal diseasenegative Renal ROS     Musculoskeletal negative  musculoskeletal ROS (+)   Abdominal (+) + obese,   Peds  Hematology negative hematology ROS (+)   Anesthesia Other Findings   Reproductive/Obstetrics                        Anesthesia Physical Anesthesia Plan  ASA: IV  Anesthesia Plan: General   Post-op Pain Management:    Induction: Intravenous  Airway Management Planned: Oral ETT  Additional Equipment:   Intra-op Plan:   Post-operative Plan: Extubation in OR  Informed Consent: I have reviewed the patients History and Physical, chart, labs and discussed the procedure including the risks, benefits and alternatives for the proposed anesthesia with the patient or authorized representative who has indicated his/her understanding and acceptance.   Dental advisory given  Plan Discussed with: CRNA  Anesthesia Plan Comments:         Anesthesia Quick Evaluation

## 2012-07-09 NOTE — Anesthesia Postprocedure Evaluation (Signed)
Anesthesia Post Note  Patient: Joshua [name]  Procedure(s) Performed: Procedure(s) (LRB): LAPAROSCOPIC GASTRIC BANDING WITH HIATAL HERNIA REPAIR (N/A) MESH APPLIED TO LAP PORT () LAPAROSCOPIC REPAIR OF HIATAL HERNIA ()  Anesthesia type: General  Patient location: PACU  Post pain: Pain level controlled  Post assessment: Post-op Vital signs reviewed  Last Vitals: BP 146/78  Pulse 87  Temp 36.9 C (Oral)  Resp 16  Ht 6\' 3"  (1.905 m)  Wt 280 lb 6 oz (127.177 kg)  BMI 35.04 kg/m2  SpO2 96%  Post vital signs: Reviewed  Level of consciousness: sedated  Complications: No apparent anesthesia complications

## 2012-07-10 ENCOUNTER — Encounter (HOSPITAL_COMMUNITY): Payer: Self-pay | Admitting: Surgery

## 2012-07-10 MED ORDER — OXYCODONE-ACETAMINOPHEN 5-325 MG/5ML PO SOLN: 5.0000 mL | ORAL | Status: DC | PRN

## 2012-07-10 NOTE — Care Management Note (Signed)
    Page 1 of 1   07/10/2012     10:38:15 AM   CARE MANAGEMENT NOTE 07/10/2012  Patient:  Joshua [name], Joshua [name]   Account Number:  [id number]  Date Initiated:  07/10/2012  Documentation initiated by:  Lorenda Ishihara  Subjective/Objective Assessment:   47 yo male admitted s/p lap band. PTA lived at home with spouse.     Action/Plan:   Home when stable   Anticipated DC Date:  07/10/2012   Anticipated DC Plan:  HOME/SELF CARE      DC Planning Services  CM consult      Choice offered to / List presented to:             Status of service:  Completed, signed off Medicare Important Message given?   (If response is "NO", the following Medicare IM given date fields will be blank) Date Medicare IM given:   Date Additional Medicare IM given:    Discharge Disposition:  HOME/SELF CARE  Per UR Regulation:  Reviewed for med. necessity/level of care/duration of stay  If discussed at Long Length of Stay Meetings, dates discussed:    Comments:

## 2012-07-10 NOTE — Progress Notes (Signed)
Pt for d/c home today. IV d/c'd. Dermabond to abdominal sites CDI. Pt tolerated protein shake w/o N/V noted. Ambulated on hallway w/o problem. Wife in to assist pt with d/c. D/C instructions & RX given with verbalized understanding.

## 2012-07-10 NOTE — Discharge Summary (Signed)
Physician Discharge Summary  Patient ID:  Joshua [name]  MRN: [id number]  DOB/AGE: [birth year] 47 y.o.  Admit date: 07/09/2012 Discharge date: 07/10/2012  Discharge Diagnoses:  1. Morbid obesity - Weight - 317, BMI - 43.1    2. Hypertension.  3. Aortic stenosis. CHF.   He was hospitalized in Dec. 2012. He got a cath at that time..   Clearance letter from Dr. Katrinka Blazing 11/16/2011  4. History of kidney stones.  5. Sleep apnea   On CPAP  6. Hyperlipidemia.  7. Getting testosterone shots q 2 weeks per Dr. Patsi Sears  8. Small hiatal hernia  Operation: Procedure(s): LAPAROSCOPIC GASTRIC BANDING WITH HIATAL HERNIA REPAIR, MESH APPLIED TO LAP PORT - 07/09/2012  Discharged Condition: good  Hospital Course: Joshua [name] is an 47 y.o. male whose primary care physician is Bradd Burner, [place] and who was admitted 07/09/2012 with a chief complaint of morbid obesity.  On his pre op upper GI, there is also a small hiatal hernia. He was brought to the operating room on 07/09/2012 and underwent  LAPAROSCOPIC GASTRIC BANDING WITH HIATAL HERNIA REPAIR, MESH APPLIED TO LAP PORT .   He is one day post op.  His KUB shows good lap band position.  He is ready to go home.  Consults: None  Significant Diagnostic Studies: Results for orders placed during the hospital encounter of 06/28/12  SURGICAL PCR SCREEN      Component Value Range   MRSA, PCR NEGATIVE  NEGATIVE   Staphylococcus aureus NEGATIVE  NEGATIVE  CBC      Component Value Range   WBC 3.6 (*) 4.0 - 10.5 K/uL   RBC 5.78  4.22 - 5.81 MIL/uL   Hemoglobin 16.5  13.0 - 17.0 g/dL   HCT 56.2  13.0 - 86.5 %   MCV 83.9  78.0 - 100.0 fL   MCH 28.5  26.0 - 34.0 pg   MCHC 34.0  30.0 - 36.0 g/dL   RDW 78.4  69.6 - 29.5 %   Platelets 272  150 - 400 K/uL  BASIC METABOLIC PANEL      Component Value Range   Sodium 135  135 - 145 mEq/L   Potassium 4.2  3.5 - 5.1 mEq/L   Chloride 99  96 - 112 mEq/L   CO2 25  19 - 32 mEq/L   Glucose, Bld 80  70 -  99 mg/dL   BUN 31 (*) 6 - 23 mg/dL   Creatinine, Ser 2.84 (*) 0.50 - 1.35 mg/dL   Calcium 9.6  8.4 - 13.2 mg/dL   GFR calc non Af Amer 61 (*) >90 mL/min   GFR calc Af Amer 71 (*) >90 mL/min    Dg Chest 2 View  06/28/2012  *RADIOLOGY REPORT*  Clinical Data: Preoperative assessment for gastric banding and possible hiatal hernia repair, past history hypertension, CHF, sleep apnea  CHEST - 2 VIEW  Comparison: 05/30/2011  Findings: Upper-normal size of cardiac silhouette. Mediastinal contours and pulmonary vascularity normal. Lungs clear. No pleural effusion or pneumothorax. Bones unremarkable.  IMPRESSION: No acute abnormalities.   Original Report Authenticated By: Ulyses Southward, M.D.    Dg Abd 1 View  07/09/2012  *RADIOLOGY REPORT*  Clinical Data: Lap band placement.  ABDOMEN - 1 VIEW  Comparison: None.  Findings: Lap band device has been placed.  The port lies in the right mid abdomen.  The tubing is connected and a band appears appropriately placed in the left upper quadrant. Bowel gas pattern nonspecific.  IMPRESSION: Satisfactory postop appearance.   Original Report Authenticated By: Davonna Belling, M.D.     Discharge Exam:  Filed Vitals:   07/10/12 0610  BP: 125/85  Pulse: 88  Temp: 99.2 F (37.3 C)  Resp: 19    General: Obese AA who is alert and generally healthy appearing.  Lungs: Clear to auscultation and symmetric breath sounds. Heart:  RRR. No murmur or rub. Abdomen: Soft. No mass.  No hernia. Normal bowel sounds. Incisions look good.   Discharge Medications:     Medication List     As of 07/10/2012  7:48 AM    ASK your doctor about these medications         magnesium oxide 400 (241.3 MG) MG tablet   Commonly known as: MAG-OX   Take 400 mg by mouth daily.      naphazoline-pheniramine 0.025-0.3 % ophthalmic solution   Commonly known as: NAPHCON-A   Place 1 drop into both eyes as needed. RED EYES      spironolactone 50 MG tablet   Commonly known as: ALDACTONE   Take 50  mg by mouth daily before breakfast.      testosterone cypionate 200 MG/ML injection   Commonly known as: DEPOTESTOTERONE CYPIONATE   Inject into the muscle every 14 (fourteen) days.      TRIBENZOR 40-5-25 MG Tabs   Generic drug: Olmesartan-Amlodipine-HCTZ   Take 1 tablet by mouth daily before breakfast.      vardenafil 20 MG tablet   Commonly known as: LEVITRA   Take 20 mg by mouth daily as needed. ED        Disposition: 01-Home or Self Care      Discharge Orders    Future Appointments: Provider: Department: Dept Phone: Center:   07/23/2012 4:00 PM [facility] Post-Op Class [facility] [phone] NDM   07/31/2012 12:00 PM Kandis Cocking, MD [facility], [place] [phone] None     Return to work on:  2 weeks, Feb 12.  Activity:  Driving - May drive in 2 or 3 days, if doing well.   Lifting - No lifting > 15 pounds for one week, then no limit  Wound Care:   May shower starting tomorrow  Diet:  Post lap band diet  Follow up appointment:  Call Dr. Allene Pyo office [facility]) at [phone] for an appointment in 2-3 weeks.   Medications and dosages:  Resume your home medications.  You have a prescription for:  Roxicet elixir  Signed: Ovidio Kin, M.D., [facility]  07/10/2012, 7:48 AM

## 2012-07-10 NOTE — Progress Notes (Signed)
Patient alert and oriented. VSS. Pain minimal. Wounds intact and unremarkable. Tolerating water. Advanced to protein drink. Discussed Bariatric Discharge instructions, patient verbalized understanding; copy provided to patient. Patient has follow-up appointments for nutrition and with Dr. Ezzard Standing.  ADJUSTABLE GASTRIC BAND DISCHARGE INSTRUCTIONS  Drs. Fredrik Rigger, Hoxworth, Abiha Lukehart, and Nash  Call if you have any problems.   Call [phone] and ask for the surgeon on call.    If you need immediate assistance come to the ER at [facility]. Tell the ER personnel that you are a new post-op gastric banding patient. Signs and symptoms to report:   Severe vomiting or nausea. If you cannot tolerate clear liquids for longer than 1 day, you need to call your surgeon.    Abdominal pain which does not get better after taking your pain medication   Fever greater than 101 F degree   Difficulty breathing   Chest pain    Redness, swelling, drainage, or foul odor at incision sites    If your incisions open or pull apart   Swelling or pain in calf (lower leg)   Diarrhea, frequent watery, uncontrolled bowel movements.   Constipation, (no bowel movements for 3 days) if this occurs, Take Milk of Magnesia, 2 tablespoons by mouth, 3 times a day for 2 days if needed.  Call your doctor if constipation continues. Stop taking Milk of Magnesia once you have had a bowel movement. You may also use Miralax according to the label instructions.   Anything you consider "abnormal for you".   Normal side effects after Surgery:   Unable to sleep at night or concentrate   Irritability   Being tearful (crying) or depressed   These are common complaints, possibly related to your anesthesia, stress of surgery and change in lifestyle, that usually go away a few weeks after surgery.  If these feelings continue, call your medical doctor.  Wound Care You may have surgical glue, steri-strips, or staples over your incisions  after surgery.  Surgical glue:  Looks like a clear film over your incisions and will wear off gradually. Steri-strips: Strips of tape over your incisions. You may notice a yellowish color on the skin underneath the steri-strips. This is a substance used to make the steri-strips stick better. Do not pull the steri-strips off - let them fall off. Staples: Cherlynn Polo may be removed before you leave the hospital. If you go home with staples, call [facility] [phone]) for an appointment with your surgeon's nurse to have staples removed in 7 - 10 days. Showering: You may shower two days after your surgery unless otherwise instructed by your surgeon. Wash gently around wounds with warm soapy water, rinse well, and gently pat dry.  If you have a drain, you may need someone to hold this while you shower. Avoid tub baths until staples are removed and incisions are healed.    Medications   Medications should be liquid or crushed if larger than the size of a dime.  Extended release pills should not be crushed.   Depending on the size and number of medications you take, you may need to stagger/change the time you take your medications so that you do not over-fill your pouch.    Make sure you follow-up with your primary care physician to make medication adjustments needed during rapid weight loss and life-style adjustment.   If you are diabetic, follow up with the doctor that prescribes your diabetes medication(s) within one week after surgery and check your blood  sugar regularly.   Do not drive while taking narcotics!   Do not take acetaminophen (Tylenol) and Roxicet or Lortab Elixir at the same time since these pain medications contain acetaminophen.  Diet at home: (First 2 Weeks)  You will see the nutritionist two weeks after your surgery. She will advance your diet if you are tolerating liquids well. Once at home, if you have severe vomiting or nausea and cannot tolerate clear liquids lasting  longer than 1 day, call your surgeon.  For Same Day Surgery Discharge Patients: The day of surgery drink water only: 2 ounces every 4 hours. If you are tolerating water, begin drinking your high protein shake the next morning. For Overnight Stay Patients: Begin high protein shake 2 ounces every 3 hours, 5 - 6 times per day.  Gradually increase the amount you drink as tolerated.  You may find it easier to slowly sip shakes throughout the day.  It is important to get your proteins in first.   Protein Shake   Drink at least 2 ounces of shake 5-6 times per day   Each serving of protein shakes should have a minimum of 15 grams of protein and no more than 5 grams of carbohydrate    Increase the amount of protein shake you drink as tolerated   Protein powder may be added to fluids such as non-fat milk or Lactaid milk (limit to 20 grams added protein powder per serving   The initial goal is to drink at least 8 ounces of protein shake/drink per day (or as directed by the nutritionist). Some examples of protein shakes are [employer], Dillard's, EAS Edge HP, and Unjury. Hydration   Gradually increase the amount of water and other liquids as tolerated (See Acceptable Fluids)   Gradually increase the amount of protein shake as tolerated     Sip fluids slowly and throughout the day   May use Sugar substitutes, use sparingly (limit to 6 - 8 packets per day).  Your fluid goal is 64 ounces of fluid daily. It may take a few weeks to build up to this.         32 oz (or more) should be clear liquids and 32 oz (or more) should be full liquids.         Liquids should not contain sugar, caffeine, or carbonation!  Acceptable Fluids Clear Liquids:   Water or Sugar-free flavored water, Fruit H2O   Decaffeinated coffee or tea (sugar-free)   Crystal Lite, Wyler's Lite, Minute Maid Lite   Sugar-free Jell-O   Bouillon or broth   Sugar-free Popsicle:   *Less than 20 calories each; Limit 1 per day   Full  Liquids:              Protein Shakes/Drinks + 2 choices per day of other full liquids shown below.    Other full liquids must be: No more than 12 grams of Carbs per serving,  No more than 3 grams of Fat per serving   Strained low-fat cream soup   Non-Fat milk   Fat-free Lactaid Milk   Sugar-free yogurt (Dannon Lite & Fit) Vitamins and Minerals (Start 1 day after surgery unless otherwise directed)   1 Chewable Multivitamin / Multimineral Supplement (i.e. Centrum for Adults)   Chewable Calcium Citrate with Vitamin D-3. Take 1500 mg each day.           (Example: 3 Chewable Calcium Plus 600 with Vitamin D-3 can be found at [facility])  Do not mix multivitamins containing iron with calcium supplements; take 2 hours   apart   Do not substitute Tums (calcium carbonate) for your calcium   Menstruating women and those at risk for anemia may need extra iron. Talk with your doctor to see if you need additional iron.     If you need extra iron:  Total daily Iron recommendations (including Vitamins) = 50 - 100 mg Iron/day Do not stop taking or change any vitamins or minerals until you talk to your nutritionist or surgeon. Your nutritionist and / or physician must approve all vitamin and mineral supplements. Exercise For maximum success, begin exercising as soon as your doctor recommends. Make sure your physician approves any physical activity.   Depending on fitness level, begin with a simple walking program   Walk 5-15 minutes each day, 7 days per week.    Slowly increase until you are walking 30-45 minutes per day   Consider joining our BELT program. [phone] or email belt@[employer] [email or web address] Things to remember:   You may have sexual relations when you feel comfortable. It is VERY important for male patients to use a reliable birth control method. Fertility often increases after surgery. Do not get pregnant for at least 18 months.   It is very important to keep all follow up appointments with your  surgeon, nutritionist, primary care physician, and behavioral health practitioner. After the first year, please follow up with your bariatric surgeon at least once a year in order to maintain best weight loss results.  [facility]: [phone] [facility]: [phone]   Free counseling is available for you and your family through collaboration between [facility] and [place]. Please call [phone] and leave a message.    Consider purchasing a medical alert bracelet that says you had lap-band surgery.    The [facility] has a free Bariatric Surgery Support Group that meets monthly, the 3rd Thursday, 6 pm, Classroom #1, [place]. You may register online at [email or web address], but registration is not necessary. Select Classes and Support Groups, Bariatric Surgery, or Call [phone]   Do not return to work or drive until cleared by your surgeon   Use your CPAP when sleeping if applicable   Do not lift anything greater than ten pounds for at least two weeks.   You will probably have your first fill (fluid added to your band) 6 weeks after surgery

## 2012-07-17 ENCOUNTER — Telehealth (INDEPENDENT_AMBULATORY_CARE_PROVIDER_SITE_OTHER): Payer: Self-pay | Admitting: General Surgery

## 2012-07-17 ENCOUNTER — Encounter (INDEPENDENT_AMBULATORY_CARE_PROVIDER_SITE_OTHER): Payer: Self-pay | Admitting: General Surgery

## 2012-07-17 NOTE — Telephone Encounter (Signed)
Pt called for RTW note.  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 Ezzard Standing.  He is confident the pt can return to work after two weeks, being careful when lifting.  Called and spoke with Christen Bame, employer HR, and she understands his concerns.  FAX his RTW note to [phone].

## 2012-07-23 ENCOUNTER — Encounter: Payer: BC Managed Care – PPO | Attending: Surgery | Admitting: *Deleted

## 2012-07-23 ENCOUNTER — Encounter: Payer: Self-pay | Admitting: *Deleted

## 2012-07-23 ENCOUNTER — Ambulatory Visit: Payer: BC Managed Care – PPO

## 2012-07-23 DIAGNOSIS — Z01818 Encounter for other preprocedural examination: Secondary | ICD-10-CM | POA: Insufficient documentation

## 2012-07-23 DIAGNOSIS — Z713 Dietary counseling and surveillance: Secondary | ICD-10-CM | POA: Insufficient documentation

## 2012-07-23 NOTE — Patient Instructions (Addendum)
   Patient to follow Phase 3A-Soft, High Protein Diet and follow-up at [facility] in 6 weeks for 2 months post-op nutrition visit for diet advancement.  Take calcium in 3 separate doses of 500 mg (1 tbsp).

## 2012-07-23 NOTE — Progress Notes (Signed)
  2 Week Post-Operative Nutrition Follow-up:   Appt start time: 1015   End time:  1110  Patient was seen on 07/23/2012 for Post-Operative Nutrition [facility].  Surgery date: 07/09/12 Surgery type: LAGB Start weight at [facility]: 305.0 lbs (12/23/11) Goal weight: None; States "just to be at a healthier weight"  Weight today: 274.5 lbs Weight change: 15.5 lbs Total weight lost: 30.5 lbs  TANITA  BODY COMP RESULTS  12/23/11 06/27/12 07/23/12   BMI (kg/m^2) 38.1 36.2 34.3   Fat Mass (lbs) -- 106.5 93.5   Fat Free Mass (lbs) -- 183.5 181.0   Total Body Water (lbs) -- 134.5 132.5   The following the learning objectives were met by the patient during this course:  Identifies Phase 3A (Soft, High Proteins) Dietary Goals and will begin from 2 weeks post-operatively to 2 months post-operatively  Identifies appropriate sources of fluids and proteins   States protein recommendations and appropriate sources post-operatively  Identifies the need for appropriate texture modifications, mastication, and bite sizes when consuming solids  Identifies appropriate multivitamin and calcium sources post-operatively  Describes the need for physical activity post-operatively and will follow MD recommendations  States when to call healthcare provider regarding medication questions or post-operative complications  Handouts given during class include:  Phase 3A: Soft, High Protein Diet Handout  Band Fill Guidelines Handout  Follow-Up Plan: Patient will follow-up at [facility] in 6 weeks for 2 months post-op nutrition visit for diet advancement per MD.

## 2012-07-31 ENCOUNTER — Telehealth (INDEPENDENT_AMBULATORY_CARE_PROVIDER_SITE_OTHER): Payer: Self-pay

## 2012-07-31 ENCOUNTER — Ambulatory Visit (INDEPENDENT_AMBULATORY_CARE_PROVIDER_SITE_OTHER): Payer: BC Managed Care – PPO | Admitting: Surgery

## 2012-07-31 ENCOUNTER — Encounter (INDEPENDENT_AMBULATORY_CARE_PROVIDER_SITE_OTHER): Payer: Self-pay | Admitting: Surgery

## 2012-07-31 NOTE — Progress Notes (Signed)
Re:   KOY LAMP DOB:   Aug 16, 1966 MRN:   [id number]  ASSESSMENT AND PLAN: 1.  Lap band - APS, with hiatal hernia repair - 07/09/2012.  Morbid obesity - Weight - 317, BMI - 43.1  Doing well.  On protein.  Will see back in 6 weeks for potential first band fill.    2.  Hypertension. 3.  Aortic stenosis.  CHF.  He was hospitalized in Dec. 2012.  He got a cath at that time..  Clearance letter from Dr. Katrinka Blazing 11/16/2011 4.  History of kidney stones. 5.  Sleep apnea  On CPAP 6.  Hyperlipidemia. 7.  Getting testosterone shots q 2 weeks per Dr. Patsi Sears 8.  Small hiatal hernia   Repaired at time of lap band  Chief Complaint  Patient presents with  . Routine Post Op    lap band fill   REFERRING PHYSICIAN: Bradd Burner, PA  HISTORY OF PRESENT ILLNESS: Joshua [name] is a 47 y.o. (DOB: [birth year])  AA male whose primary care physician is Bradd Burner, [place] and comes to me today for post op lap band.  He is doing well.  He has had no vomiting.  He has an exercise routine of walking -15 minutes, stationary bike - 15 mintues, elliptical - 15 minutes, and 50 sit ups.    Weight history: The patient has been overweight much of his adult life. He heard Dr. Wenda Low speak at our information session. He is interested in the lap band. He knows some friends who have had weight loss surgery, but he is unsure of the type of surgery they had.  He has tried multiple diets including: [employer], low-carb dot, a book called "The skinny and", and vigorous exercise. He was running and successfully lost done to about 250 pounds. However, he had arthritic problems with his knees which stopped his exercise and he has gained weight back.  After hearing Dr. Daphine Deutscher speak of information session he has cut back on sugars and has lost about 10 pounds.  He has no history of stomach disease.  No history of liver disease.  No history of gall bladder disease.  No history of pancreas  disease.  No history of colon disease.  His most significant past history is he was hospitalized in December 2012 for congestive heart failure. Dr. Garnette Scheuermann is his cardiologist.  Past Medical History  Diagnosis Date  . Hypertension   . Aortic stenosis   . Kidney stone     nephrolithiasis  . Hypogonadism male   . Heart murmur   . Sleep apnea   . Hyperlipidemia   . CHF (congestive heart failure) DEC 2012  . 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 Mendel Ryder  . H/O hiatal hernia   . Arthritis     LEFT KNEE     Current Outpatient Prescriptions  Medication Sig Dispense Refill  . naphazoline-pheniramine (NAPHCON-A) 0.025-0.3 % ophthalmic solution Place 1 drop into both eyes as needed. RED EYES      . Olmesartan-Amlodipine-HCTZ (TRIBENZOR) 40-5-25 MG TABS Take 1 tablet by mouth daily before breakfast.       . spironolactone (ALDACTONE) 50 MG tablet Take 50 mg by mouth daily before breakfast.      . testosterone cypionate (DEPOTESTOTERONE CYPIONATE) 200 MG/ML injection Inject into the muscle every 14 (fourteen) days.       Marland Kitchen  vardenafil (LEVITRA) 20 MG tablet Take 20 mg by mouth daily as needed. ED      . magnesium oxide (MAG-OX) 400 (241.3 MG) MG tablet Take 400 mg by mouth daily as needed.        No current facility-administered medications for this visit.     Allergies  Allergen Reactions  . Bystolic (Nebivolol Hcl) Shortness Of Breath    Leg swelling,difficulty breathing  . Atenolol Swelling    Erectile dysfunction    REVIEW OF SYSTEMS: Cardiac:  Hypertension x 9 years.  CHF.  Aortic stenosis.  Followed by Dr. Mendel Ryder. Pulmonary:  Sleep apnea x 10 years.  Urologic:  History of kidney stones.  Last about 2008. Musculoskeletal:  Knee arthritis, but is not seeing an ortho at this time. Psycho-social:  The patient is oriented.   Saw Dr. Kimber Relic 01/16/2012  SOCIAL and FAMILY  HISTORY: Married. Works at [employer].  PHYSICAL EXAM: BP 122/72  Pulse 75  Temp(Src) 97.6 F (36.4 C) (Temporal)  Ht 6' (1.829 m)  Wt 277 lb (125.646 kg)  BMI 37.56 kg/m2  SpO2 96%  General: AA obese M who is alert and generally healthy appearing.  Lungs: Clear to auscultation and symmetric breath sounds. Heart:  RRR. 3/6 sternal murmur. Abdomen: Soft.  Normal bowel sounds.  No abdominal scars.  He does have a small pannus.  He is slightly more apple than pear.  Wounds look good.  DATA REVIEWED: Chart data.  Ovidio Kin, MD,  [facility] [address] [place]  [address]   [place], [place]    [address] Phone:  [phone] FAX:  [phone]

## 2012-07-31 NOTE — Telephone Encounter (Signed)
V/M appt 09/20/12@12  n. Advised to call if appt is not good for him.

## 2012-09-03 ENCOUNTER — Encounter: Payer: BC Managed Care – PPO | Attending: Surgery | Admitting: *Deleted

## 2012-09-03 ENCOUNTER — Encounter: Payer: Self-pay | Admitting: *Deleted

## 2012-09-03 DIAGNOSIS — Z01818 Encounter for other preprocedural examination: Secondary | ICD-10-CM | POA: Insufficient documentation

## 2012-09-03 DIAGNOSIS — Z713 Dietary counseling and surveillance: Secondary | ICD-10-CM | POA: Insufficient documentation

## 2012-09-03 NOTE — Progress Notes (Addendum)
  Follow-up visit:  8 Weeks Post-Operative LAGB Surgery  Medical Nutrition Therapy:  Appt start time: 1030  End time:  1100.  Primary concerns today: Post-operative Bariatric Surgery Nutrition Management. Started non-starchy veggies 2 weeks ago. Doing well.   Surgery date: 07/09/12 Surgery type: LAGB Start weight at [facility]: 305.0 lbs (12/23/11) Goal weight: None; states "just to be at a healthier weight"  Weight today: 274.0 lbs Weight change: 0.5 lbs Total weight lost: 31.0 lbs  TANITA  BODY COMP RESULTS  12/23/11 06/27/12 07/23/12 09/03/12   BMI (kg/m^2) 38.1 36.2 34.3 34.2   Fat Mass (lbs) -- 106.5 93.5 90.0   Fat Free Mass (lbs) -- 183.5 181.0 184.0   Total Body Water (lbs) -- 134.5 132.5 134.5   24-hr recall: B (AM): 2 scrambled eggs, 1-2 pcs whole wheat toast, 1 pkt plain oatmeal w/ honey Snk (AM): NONE  L (PM): [place] burger on whole wheat bun (plain) and sometimes peanut butter cracker Snk (PM): NONE  D (PM): Rotisserie chicken (3-4 oz) Snk (PM): 20 Beanitos (black bean chips) w/ cottage cheese/salsa  Fluid intake: ~ 64 oz Estimated total protein intake: ~ 80g  Medications: See medication list Supplementation: Taking  Using straws: No Drinking while eating: Yes b/c gets hiccups and painful Hair loss: No Carbonated beverages: No N/V/D/C:  None Last Lap-Band fill: None; feels in green zone  Recent physical activity:  4 days/week @ gym - walking around gym, stationary bike 15 min, 50 sit ups, & ends with elliptical for 10 min.   Progress Towards Goal(s):  In progress.  Samples given during visit include:   Premier Protein shake: 1 ea Lot: [id number]; Exp: 04/19/13 Lot: [id number]; Exp: 06/24/13   Nutritional Diagnosis:  [place]-3.3 Overweight/obesity related to past poor dietary habits and physical inactivity as evidenced by patient w/ recent LAGB surgery following dietary guidelines for continued weight loss.    Intervention:  Nutrition education/diet  advancement.  Monitoring/Evaluation:  Dietary intake, exercise, lap band fills, and body weight. Follow up in 1 months for 3 month post-op visit.

## 2012-09-03 NOTE — Patient Instructions (Addendum)
Goals:  Follow Phase 3B: High Protein + Non-Starchy Vegetables  Eat 3-6 small meals/snacks, every 3-5 hrs  Increase lean protein foods to meet 80-90g goal  Increase fluid intake to 64oz +  Avoid drinking 15 minutes before, during and 30 minutes after eating  Aim for >30 min of physical activity daily

## 2012-09-20 ENCOUNTER — Encounter (INDEPENDENT_AMBULATORY_CARE_PROVIDER_SITE_OTHER): Payer: BC Managed Care – PPO | Admitting: Surgery

## 2012-09-26 ENCOUNTER — Encounter (INDEPENDENT_AMBULATORY_CARE_PROVIDER_SITE_OTHER): Payer: BC Managed Care – PPO | Admitting: Surgery

## 2012-09-30 ENCOUNTER — Encounter (INDEPENDENT_AMBULATORY_CARE_PROVIDER_SITE_OTHER): Payer: Self-pay

## 2012-10-02 ENCOUNTER — Encounter (INDEPENDENT_AMBULATORY_CARE_PROVIDER_SITE_OTHER): Payer: Self-pay | Admitting: Surgery

## 2012-10-02 ENCOUNTER — Ambulatory Visit (INDEPENDENT_AMBULATORY_CARE_PROVIDER_SITE_OTHER): Payer: BC Managed Care – PPO | Admitting: Surgery

## 2012-10-02 DIAGNOSIS — Z9884 Bariatric surgery status: Secondary | ICD-10-CM

## 2012-10-02 NOTE — Progress Notes (Signed)
Re:   Joshua [name] DOB:   10-30-1965 MRN:   [id number]  ASSESSMENT AND PLAN: 1.  Lap band - APS, with hiatal hernia repair - 07/09/2012.  Morbid obesity - Weight - 317, BMI - 43.1  Lap band filled to about 4.5 cc.  Will see back in 8 weeks.    2.  Hypertension. 3.  Aortic stenosis.  CHF.  He was hospitalized in Dec. 2012.  He got a cath at that time..  Clearance letter from Dr. Katrinka Blazing 11/16/2011 4.  History of kidney stones. 5.  Sleep apnea  On CPAP 6.  Hyperlipidemia. 7.  Getting testosterone shots q 2 weeks per Dr. Patsi Sears 8.  Small hiatal hernia   Repaired at time of lap band  No chief complaint on file.  REFERRING PHYSICIAN: Bradd Burner, PA-C  HISTORY OF PRESENT ILLNESS: Joshua [name] is a 47 y.o. (DOB: 05/07/1966)  AA male whose primary care physician is Bradd Burner, PA-C and comes to me today for follow up of a lap band.  He is doing well.  He has had no vomiting. He did notice that bread hung up some.  He is exercising about 3 days a week.   (He has an exercise routine of walking -15 minutes, stationary bike - 15 mintues, elliptical - 15 minutes, and 50 sit ups.)   I've encouraged him to get up to 4 to 5 days a week.  Weight history: The patient has been overweight much of his adult life. He heard Dr. Wenda Low speak at our information session. He is interested in the lap band. He knows some friends who have had weight loss surgery, but he is unsure of the type of surgery they had. He has tried multiple diets including: [employer], low-carb dot, a book called "The skinny and", and vigorous exercise. He was running and successfully lost done to about 250 pounds. However, he had arthritic problems with his knees which stopped his exercise and he has gained weight back. After hearing Dr. Daphine Deutscher speak of information session he has cut back on sugars and has lost about 10 pounds.  He has no history of stomach disease.  No history of liver  disease.  No history of gall bladder disease.  No history of pancreas disease.  No history of colon disease. His most significant past history is he was hospitalized in December 2012 for congestive heart failure. Dr. Garnette Scheuermann is his cardiologist.  Past Medical History  Diagnosis Date  . Hypertension   . Aortic stenosis   . Kidney stone     nephrolithiasis  . Hypogonadism male   . Heart murmur   . Sleep apnea   . Hyperlipidemia   . CHF (congestive heart failure) DEC 2012  . 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 Mendel Ryder  . H/O hiatal hernia   . Arthritis     LEFT KNEE     Current Outpatient Prescriptions  Medication Sig Dispense Refill  . magnesium oxide (MAG-OX) 400 (241.3 MG) MG tablet Take 400 mg by mouth daily as needed.       . naphazoline-pheniramine (NAPHCON-A) 0.025-0.3 % ophthalmic solution Place 1 drop into both eyes as needed. RED EYES      . Olmesartan-Amlodipine-HCTZ (TRIBENZOR) 40-5-25 MG TABS Take 0.5 tablets by mouth daily before breakfast.       .  spironolactone (ALDACTONE) 50 MG tablet Take 50 mg by mouth daily before breakfast.      . testosterone cypionate (DEPOTESTOTERONE CYPIONATE) 200 MG/ML injection Inject into the muscle every 14 (fourteen) days.       . vardenafil (LEVITRA) 20 MG tablet Take 20 mg by mouth daily as needed. ED       No current facility-administered medications for this visit.     Allergies  Allergen Reactions  . Bystolic (Nebivolol Hcl) Shortness Of Breath    Leg swelling,difficulty breathing  . Atenolol Swelling    Erectile dysfunction    REVIEW OF SYSTEMS: Cardiac:  Hypertension x 9 years.  CHF.  Aortic stenosis.  Followed by Dr. Mendel Ryder. Pulmonary:  Sleep apnea x 10 years.  Urologic:  History of kidney stones.  Last about 2008. Musculoskeletal:  Knee arthritis, but is not seeing an ortho at this time. Psycho-social:   The patient is oriented.   Saw Dr. Kimber Relic 01/16/2012  SOCIAL and FAMILY HISTORY: Married. Works at [employer].  PHYSICAL EXAM: BP 122/86  Pulse 71  Temp(Src) 97.3 F (36.3 C) (Temporal)  Resp 18  Ht 6\' 3"  (1.905 m)  Wt 280 lb 3.2 oz (127.098 kg)  BMI 35.02 kg/m2  General: AA obese M who is alert and generally healthy appearing.  Lungs: Clear to auscultation and symmetric breath sounds. Heart:  RRR. 3/6 sternal murmur. Abdomen: Soft.  Normal bowel sounds.  Port okay.  Moderate sized pannus.  Procedure:  I accessed his lap band.  He had very little fluid in the lap band, may be 0.5 cc.  I added 4.0 cc for a predicted total of 4.5 cc.  DATA REVIEWED: None new.  Ovidio Kin, MD,  [facility] [address] [place]  [address]   [place], [place]    [address] Phone:  [phone] FAX:  [phone]

## 2012-10-07 ENCOUNTER — Encounter: Payer: BC Managed Care – PPO | Attending: Surgery | Admitting: *Deleted

## 2012-10-07 ENCOUNTER — Encounter: Payer: Self-pay | Admitting: *Deleted

## 2012-10-07 DIAGNOSIS — Z01818 Encounter for other preprocedural examination: Secondary | ICD-10-CM | POA: Insufficient documentation

## 2012-10-07 DIAGNOSIS — Z713 Dietary counseling and surveillance: Secondary | ICD-10-CM | POA: Insufficient documentation

## 2012-10-07 NOTE — Patient Instructions (Addendum)
Goals:  Follow Phase 3B: High Protein + Non-Starchy Vegetables  Increase lean protein foods to meet 80-90g goal  Try chicken salad, egg/tuna salad; Unjury unflavored in drinks/soup  Avoid drinking 15 minutes before, during and 30 minutes after eating  Slow down while eating to help prevent regurgitation  Contact Dr. Ezzard Standing if regurgitation, discomfort while eating, or night coughs develop

## 2012-10-07 NOTE — Progress Notes (Addendum)
  Follow-up visit:  12 Weeks Post-Operative LAGB Surgery  Medical Nutrition Therapy:  Appt start time:  1000  End time:  1030.  Primary concerns today: Post-operative Bariatric Surgery Nutrition Management.  Surgery date: 07/09/12 Surgery type: LAGB Start weight at [facility]: 305.0 lbs (12/23/11) Goal weight: None; states "just to be at a healthier weight"  Weight today: 269.5 lbs Weight change: 4.5 lbs Total weight lost: 35.5 lbs  TANITA  BODY COMP RESULTS  12/23/11 06/27/12 07/23/12 09/03/12 10/07/12   BMI (kg/m^2) 38.1 36.2 34.3 34.2 33.7   Fat Mass (lbs) -- 106.5 93.5 90.0 86.0   Fat Free Mass (lbs) -- 183.5 181.0 184.0 183.5   Total Body Water (lbs) -- 134.5 132.5 134.5 134.5   24-hr recall: (works 3-11 pm shift) B (9-10 AM): 2 scrambled eggs, 1 pkt plain oatmeal w/ honey (15g) Snk (5:30 PM): 14 oz Oh Yeah shake (32g) L (PM): Fish w/ broccoli and slaw (20-25g); Since fill having - [employer] broccoli and cheese soup. (6g) Snk (9 PM): Oh Yeah shake (32g) D (PM): Rotisserie chicken (3-4 oz) - (30g) Snk (PM): NONE  Fluid intake: ~ 64 oz Estimated total protein intake: 100-130g (drinks mainly protein shakes after fills)  Medications: See medication list Supplementation: Taking  Using straws: No Drinking while eating: Yes (hiccups and painful if does not) Hair loss: No Carbonated beverages: No N/V/D/C:  Regurgitated chicken leg; likely eating too fast. Last Lap-Band fill:  10/02/12; 4 cc added  Recent physical activity:  3 days/week @ gym - walking around gym, stationary bike 15 min, 50 sit ups, & ends with elliptical for 10 min.  Rides bike at [facility] when weather is good on Saturdays  Progress Towards Goal(s):  In progress.  Nutritional Diagnosis:  [place]-3.3 Overweight/obesity related to past poor dietary habits and physical inactivity as evidenced by patient w/ recent LAGB surgery following dietary guidelines for continued weight loss.    Intervention:  Nutrition  education.  Samples given during visit include:   Unjury Protein Powder: 4 pkts       Lot: [id number]; Exp: 04/15  Monitoring/Evaluation:  Dietary intake, exercise, lap band fills, and body weight. Follow up in 3 months for 6 month post-op visit.

## 2012-10-17 ENCOUNTER — Encounter (INDEPENDENT_AMBULATORY_CARE_PROVIDER_SITE_OTHER): Payer: Self-pay

## 2012-10-17 ENCOUNTER — Ambulatory Visit (INDEPENDENT_AMBULATORY_CARE_PROVIDER_SITE_OTHER): Payer: BC Managed Care – PPO | Admitting: Physician Assistant

## 2012-10-17 VITALS — BP 118/78 | HR 76 | Temp 97.1°F | Resp 16 | Ht 75.0 in | Wt 270.2 lb

## 2012-10-17 DIAGNOSIS — Z4651 Encounter for fitting and adjustment of gastric lap band: Secondary | ICD-10-CM

## 2012-10-17 NOTE — Progress Notes (Signed)
  HISTORY: Joshua [name] is a 47 y.o.male who received an AP-Standard lap-band in January 2014 by Dr. Ezzard Standing. He comes in with persistent solid food dysphagia and occasional difficulty getting liquids down since his 4 mL fill on 4/23. He has lost 10 lbs.  VITAL SIGNS: Filed Vitals:   10/17/12 1448  BP: 118/78  Pulse: 76  Temp: 97.1 F (36.2 C)  Resp: 16    PHYSICAL EXAM: Physical exam reveals a very well-appearing 46 y.o.male in no apparent distress Neurologic: Awake, alert, oriented Psych: Bright affect, conversant Respiratory: Breathing even and unlabored. No stridor or wheezing Abdomen: Soft, nontender, nondistended to palpation. Incisions well-healed. No incisional hernias. Port easily palpated. Extremities: Atraumatic, good range of motion.  ASSESMENT: 47 y.o.  male  s/p AP-Standard lap-band.   PLAN: The patient's port was accessed with a 20G Huber needle without difficulty. Clear fluid was aspirated and 1.5 mL saline was removed from the port to give a total predicted volume of 3 mL. The patient was advised to concentrate on healthy food choices and to avoid slider foods high in fats and carbohydrates. He was able to drink water with no difficulty at all following fluid removal.

## 2012-10-17 NOTE — Patient Instructions (Signed)
Return in June with Dr. Ezzard Standing. Focus on good food choices as well as physical activity. Return sooner if you have an increase in hunger, portion sizes or weight. Return also for difficulty swallowing, night cough, reflux.

## 2012-11-28 ENCOUNTER — Encounter (INDEPENDENT_AMBULATORY_CARE_PROVIDER_SITE_OTHER): Payer: BC Managed Care – PPO | Admitting: Surgery

## 2013-01-01 ENCOUNTER — Encounter (INDEPENDENT_AMBULATORY_CARE_PROVIDER_SITE_OTHER): Payer: Self-pay | Admitting: Surgery

## 2013-01-01 ENCOUNTER — Ambulatory Visit (INDEPENDENT_AMBULATORY_CARE_PROVIDER_SITE_OTHER): Payer: BC Managed Care – PPO | Admitting: Surgery

## 2013-01-01 DIAGNOSIS — Z9884 Bariatric surgery status: Secondary | ICD-10-CM

## 2013-01-01 NOTE — Progress Notes (Signed)
Re:   Joshua [name] DOB:   07-07-1965 MRN:   [id number]  ASSESSMENT AND PLAN: 1.  Lap band - APS, with hiatal hernia repair - 07/09/2012 - D. Dina Mobley.  Morbid obesity - Initial weight - 317, BMI - 43.1  No adjustment today.  Talked about food choices.  Will see back in 8 weeks.    2.  Hypertension. 3.  Aortic stenosis.  CHF.  He was hospitalized in Dec. 2012.  He got a cath at that time..  Clearance letter from Dr. Katrinka Blazing 11/16/2011 4.  History of kidney stones. 5.  Sleep apnea  On CPAP 6.  Hyperlipidemia. 7.  Getting testosterone shots q 2 weeks per Dr. Patsi Sears 8.  Small hiatal hernia   Repaired at time of lap band  Chief Complaint  Patient presents with  . Lap Band Fill   REFERRING PHYSICIAN: Bradd Burner, PA-C  HISTORY OF PRESENT ILLNESS: Joshua [name] is a 47 y.o. (DOB: 1965-10-18)  AA male whose primary care physician is Bradd Burner, PA-C and comes to me today for follow up of a lap band.  Joshua [name] saw him 10/17/2012 and he withdrew 1.5 cc. He has about 3.0 cc in his lap band.   He has gained about 14 pounds since he was last seen, but part of it was that he was "low" from a too tight lap band.   Joshua [name] is afraid of being overfilled again and does not want a fill today.  He thinks that he is in the "green zone".   He admits to making wrong food choices.  He has an appt to see Kasandra Knudsen on Monday, 01/06/2013.  She can help him with some food decisions.  He is still exercising about 3 days a week.   (He has an exercise routine of walking -15 minutes, stationary bike - 15 mintues, elliptical - 15 minutes, and 50 sit ups.)   I've encouraged him to get up to 4 to 5 days a week.  But he says that work has been busy and he is working 6 days a week.  Weight history: The patient has been overweight much of his adult life. He heard Dr. Wenda Low speak at our information session. He is interested in the lap band. He knows some friends who have had weight loss  surgery, but he is unsure of the type of surgery they had. He has tried multiple diets including: [employer], low-carb dot, a book called "The skinny and", and vigorous exercise. He was running and successfully lost done to about 250 pounds. However, he had arthritic problems with his knees which stopped his exercise and he has gained weight back. After hearing Dr. Daphine Deutscher speak of information session he has cut back on sugars and has lost about 10 pounds.  He has no history of stomach disease.  No history of liver disease.  No history of gall bladder disease.  No history of pancreas disease.  No history of colon disease. His most significant past history is he was hospitalized in December 2012 for congestive heart failure. Dr. Garnette Scheuermann is his cardiologist.  Past Medical History  Diagnosis Date  . Hypertension   . Aortic stenosis   . Kidney stone     nephrolithiasis  . Hypogonadism male   . Heart murmur   . Sleep apnea   . Hyperlipidemia   . CHF (congestive heart failure) DEC 2012  . 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 Mendel Ryder  . H/O hiatal hernia   . Arthritis     LEFT KNEE     Current Outpatient Prescriptions  Medication Sig Dispense Refill  . magnesium oxide (MAG-OX) 400 (241.3 MG) MG tablet Take 400 mg by mouth daily as needed.       . meloxicam (MOBIC) 15 MG tablet       . naphazoline-pheniramine (NAPHCON-A) 0.025-0.3 % ophthalmic solution Place 1 drop into both eyes as needed. RED EYES      . Olmesartan-Amlodipine-HCTZ (TRIBENZOR) 20-5-12.5 MG TABS Take by mouth.      . spironolactone (ALDACTONE) 50 MG tablet Take 25 mg by mouth daily before breakfast.       . testosterone cypionate (DEPOTESTOTERONE CYPIONATE) 200 MG/ML injection Inject into the muscle every 14 (fourteen) days.       . vardenafil (LEVITRA) 20 MG tablet Take 20 mg by mouth daily as needed. ED        No current facility-administered medications for this visit.     Allergies  Allergen Reactions  . Bystolic (Nebivolol Hcl) Shortness Of Breath    Leg swelling,difficulty breathing  . Atenolol Swelling    Erectile dysfunction    REVIEW OF SYSTEMS: Cardiac:  Hypertension x 9 years.  CHF.  Aortic stenosis.  Followed by Dr. Mendel Ryder. Pulmonary:  Sleep apnea x 10 years.  Urologic:  History of kidney stones.  Last about 2008. Musculoskeletal:  Knee arthritis, but is not seeing an ortho at this time. Psycho-social:  The patient is oriented.   Saw Dr. Kimber Relic 01/16/2012  SOCIAL and FAMILY HISTORY: Married. Works at [employer]  PHYSICAL EXAM: BP 126/83  Pulse 62  Resp 16  Ht 6\' 3"  (1.905 m)  Wt 284 lb (128.822 kg)  BMI 35.5 kg/m2  General: AA obese M who is alert and generally healthy appearing.  Lungs: Clear to auscultation and symmetric breath sounds. Heart:  RRR. 3/6 sternal murmur. Abdomen: Soft.  Normal bowel sounds.  Port okay.  Moderate sized pannus.  Procedure:  No adjustment today.  DATA REVIEWED: None new.  Ovidio Kin, MD,  [facility] [address] [place]  [address]   [place], [place]    [address] Phone:  [phone] FAX:  [phone]

## 2013-01-06 ENCOUNTER — Encounter: Payer: BC Managed Care – PPO | Attending: Surgery | Admitting: *Deleted

## 2013-01-06 DIAGNOSIS — Z01818 Encounter for other preprocedural examination: Secondary | ICD-10-CM | POA: Insufficient documentation

## 2013-01-06 DIAGNOSIS — Z713 Dietary counseling and surveillance: Secondary | ICD-10-CM | POA: Insufficient documentation

## 2013-01-06 NOTE — Progress Notes (Signed)
  Follow-up visit:  6 Month Post-Operative LAGB Surgery  Medical Nutrition Therapy:  Appt start time:  1000  End time:  1030.  Primary concerns today: Post-operative Bariatric Surgery Nutrition Management. Joshua [name] returns today with a gain of 8 lbs. Believes the problem is that he has only 20 min for lunch.  Also states money is tight and was getting 2 pieces of fried chicken with slaw and part of a biscuit at [employer]. Excessive CHO and fat intake noted in food recall.  Reports being very tight in the morning and often has nothing at all for breakfast. Continues previous level of exercise.    Surgery date: 07/09/12 Surgery type: LAGB Start weight at [facility]: 305.0 lbs (12/23/11) Goal weight: None; states "just to be at a healthier weight"  Weight today: 277.5 lbs Weight change: 8.0 lb GAIN Total weight lost: 27.5 lbs  TANITA  BODY COMP RESULTS  12/23/11 06/27/12 07/23/12 09/03/12 10/07/12 01/06/13   BMI (kg/m^2) 38.1 36.2 34.3 34.2 33.7 35.6   Fat Mass (lbs) -- 106.5 93.5 90.0 86.0 95.5   Fat Free Mass (lbs) -- 183.5 181.0 184.0 183.5 182.0   Total Body Water (lbs) -- 134.5 132.5 134.5 134.5 133.0   24-hr recall: (works 3p - 11p) B (9-10 AM): 2 scrambled eggs, 1 pkt plain oatmeal w/ honey (15g) or NONE Snk (5:30 PM): Yogurt bar Quaker strawberry (22g CHO, 2 g protein) L (PM): [place] patty (ready made), 3-4 oz almonds - 30g Snk (9 PM): Yogurt bar (2 g) D (PM): Fried chicken (3-4 oz) = (25-30g) Snk (PM): 14 oz Oh Yeah shake w/ banana, peanut butter, 1 pkt oatmeal, ice/water (32g) - after workout  Fluid intake: ~ 64 oz Estimated total protein intake: 80-130g  Medications: See medication list Supplementation: Taking  Using straws: No Drinking while eating: Yes; trying to take baby sips (hiccups and painful if does not) Hair loss: No Carbonated beverages: No  N/V/D/C:  Regurgitation often d/t eating too fast. Last Lap-Band fill:  10/17/12; 1.5 cc removed. Reports being very tight in the  morning  Recent physical activity:  3 days/week @ gym - walking around gym, stationary bike 15 min, 50 sit ups, & ends with elliptical for 10 min.  Rides bike at [facility] when weather is good on Saturdays  Progress Towards Goal(s):  In progress.  Nutritional Diagnosis:  [place]-3.3 Overweight/obesity related to past poor dietary habits and physical inactivity as evidenced by patient w/ recent LAGB surgery following dietary guidelines for continued weight loss.  Monitoring/Evaluation:  Dietary intake, exercise, lap band fills, and body weight. Follow up in 6 months for 12 month post-op visit.

## 2013-01-06 NOTE — Patient Instructions (Addendum)
Goals:  Follow Phase 3B: High Protein + Non-Starchy Vegetables  Continue intake of lean protein foods to meet 80-90g goal  Try PB2 (powdered peanut butter) - [employer]  Try removing skin from fried chicken and take very small bites of it with the chicken  In protein shake, use only 1/2 a banana and add PB2 instead of regular peanut butter  Watch amount of almonds  Avoid drinking 15 minutes before, during and 30 minutes after eating  Slow down while eating to help prevent regurgitation  Contact Dr. Ezzard Standing if regurgitation, discomfort while eating, or night coughs develop

## 2013-02-19 ENCOUNTER — Encounter (INDEPENDENT_AMBULATORY_CARE_PROVIDER_SITE_OTHER): Payer: BC Managed Care – PPO | Admitting: Surgery

## 2013-02-21 ENCOUNTER — Ambulatory Visit (INDEPENDENT_AMBULATORY_CARE_PROVIDER_SITE_OTHER): Payer: BC Managed Care – PPO | Admitting: Surgery

## 2013-02-21 ENCOUNTER — Encounter (INDEPENDENT_AMBULATORY_CARE_PROVIDER_SITE_OTHER): Payer: Self-pay | Admitting: Surgery

## 2013-02-21 DIAGNOSIS — Z6841 Body Mass Index (BMI) 40.0 and over, adult: Secondary | ICD-10-CM

## 2013-02-21 DIAGNOSIS — I10 Essential (primary) hypertension: Secondary | ICD-10-CM

## 2013-02-21 DIAGNOSIS — Z9884 Bariatric surgery status: Secondary | ICD-10-CM

## 2013-02-21 NOTE — Progress Notes (Signed)
Re:   Joshua [name] DOB:   1966-01-08 MRN:   [id number]  ASSESSMENT AND PLAN: 1.  Lap band - APS, with hiatal hernia repair - 07/09/2012 - Joshua [name].  Morbid obesity - Initial weight - 317, BMI - 43.1  No adjustment today.  Joshua [name] is doing fairly well.  Joshua [name] has lost 2 pounds - which I tried to tell him was reasonable.  Return to office in 3 months.   2.  Hypertension. 3.  Aortic stenosis.  CHF.  Joshua [name] was hospitalized in Dec. 2012.  Joshua [name] got a cath at that time..  Clearance letter from Dr. Katrinka [name] 11/16/2011 4.  History of kidney stones. 5.  Sleep apnea  On CPAP 6.  Hyperlipidemia. 7.  Getting testosterone shots q 2 weeks per Dr. Patsi [name] 8.  Small hiatal hernia   Repaired at time of lap band  Chief Complaint  Patient presents with  . Lap Band Fill   REFERRING PHYSICIAN: Bradd Burner, PA-C  HISTORY OF PRESENT ILLNESS: Joshua [name] is a 47 y.o. (DOB: 07-10-1965)  AA male whose primary care physician is Joshua Burner, PA-C and comes to me today for follow up of a lap band.  Joshua [name] is doing fairly well.  For breakfast Joshua [name] east oatmeal and [place] bacon.  Joshua [name] notices that breakfast is his tightest time of day. Joshua [name] admits that Joshua [name] is working hard and is having little time to exercise.  We talked about the importance of exercise.  I want him to try to get to 5 to 6 times per week.  Joshua [name] is exercising about 3 days a week.   (Joshua [name] has an exercise routine of walking -15 minutes, stationary bike - 15 mintues, elliptical - 15 minutes, and 50 sit ups.)    Weight history: The patient has been overweight much of his adult life. Joshua [name] heard Dr. Wenda [name] speak at our information session. Joshua [name] is interested in the lap band. Joshua [name] knows some friends who have had weight loss surgery, but Joshua [name] is unsure of the type of surgery they had. Joshua [name] has tried multiple diets including: [employer], [name]-carb dot, a book called "The skinny and", and vigorous exercise. Joshua [name] was running and successfully lost done to  about 250 pounds. However, Joshua [name] had arthritic problems with his knees which stopped his exercise and Joshua [name] has gained weight back. After hearing Dr. Daphine [name] speak of information session Joshua [name] has cut back on sugars and has lost about 10 pounds.  Joshua [name] has no history of stomach disease.  No history of liver disease.  No history of gall bladder disease.  No history of pancreas disease.  No history of colon disease. His most significant past history is Joshua [name] was hospitalized in December 2012 for congestive heart failure. Dr. Garnette [name] is his cardiologist.  Past Medical History  Diagnosis Date  . Hypertension   . Aortic stenosis   . Kidney stone     nephrolithiasis  . Hypogonadism male   . Heart murmur   . Sleep apnea   . Hyperlipidemia   . CHF (congestive heart failure) DEC 2012  . 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 Mendel [name]  . H/O hiatal hernia   . Arthritis     LEFT KNEE     Current Outpatient Prescriptions  Medication Sig Dispense Refill  . magnesium oxide (MAG-OX) 400 (241.3 MG) MG  tablet Take 400 mg by mouth daily as needed.       . naphazoline-pheniramine (NAPHCON-A) 0.025-0.3 % ophthalmic solution Place 1 drop into both eyes as needed. RED EYES      . naproxen sodium (ANAPROX) 220 MG tablet Take 220 mg by mouth 2 (two) times daily with a meal.      . Olmesartan-Amlodipine-HCTZ (TRIBENZOR) 20-5-12.5 MG TABS Take 1 tablet by mouth daily.       Marland Kitchen spironolactone (ALDACTONE) 50 MG tablet Take 25 mg by mouth daily before breakfast.       . testosterone cypionate (DEPOTESTOTERONE CYPIONATE) 200 MG/ML injection Inject into the muscle every 14 (fourteen) days.       . vardenafil (LEVITRA) 20 MG tablet Take 20 mg by mouth daily as needed. ED       No current facility-administered medications for this visit.     Allergies  Allergen Reactions  . Bystolic [Nebivolol Hcl] Shortness Of Breath     Leg swelling,difficulty breathing  . Atenolol Swelling    Erectile dysfunction    REVIEW OF SYSTEMS: Cardiac:  Hypertension x 9 years.  CHF.  Aortic stenosis.  Followed by Dr. Mendel [name]. Pulmonary:  Sleep apnea x 10 years.  Urologic:  History of kidney stones.  Last about 2008. Musculoskeletal:  Knee arthritis, but is not seeing an ortho at this time. Psycho-social:  The patient is oriented.   Saw Dr. Kimber [name] 01/16/2012  SOCIAL and FAMILY HISTORY: Married. Works at [employer]  PHYSICAL EXAM: BP 128/74  Pulse 60  Temp(Src) 98.7 F (37.1 C)  Resp 14  Ht 6\' 2"  (1.88 m)  Wt 282 lb 6.4 oz (128.096 kg)  BMI 36.24 kg/m2  General: AA obese M who is alert and generally healthy appearing.  Lungs: Clear to auscultation and symmetric breath sounds. Heart:  RRR. 3/6 sternal murmur. Abdomen: Soft.  Normal bowel sounds.  Port okay.  Moderate sized pannus.  Procedure:  No adjustment today.  DATA REVIEWED: None new.  Joshua Kin, MD,  [facility] [address] [place]  [address]   [place], [place]    [address] Phone:  [phone] FAX:  [phone]

## 2013-04-17 ENCOUNTER — Other Ambulatory Visit: Payer: Self-pay

## 2013-04-20 ENCOUNTER — Encounter: Payer: Self-pay | Admitting: Interventional Cardiology

## 2013-04-23 ENCOUNTER — Ambulatory Visit: Payer: BC Managed Care – PPO | Admitting: Interventional Cardiology

## 2013-05-22 ENCOUNTER — Encounter: Payer: Self-pay | Admitting: Interventional Cardiology

## 2013-05-22 ENCOUNTER — Ambulatory Visit (INDEPENDENT_AMBULATORY_CARE_PROVIDER_SITE_OTHER): Payer: BC Managed Care – PPO | Admitting: Interventional Cardiology

## 2013-05-22 VITALS — BP 130/90 | HR 80 | Ht 74.0 in | Wt 288.0 lb

## 2013-05-22 DIAGNOSIS — I359 Nonrheumatic aortic valve disorder, unspecified: Secondary | ICD-10-CM

## 2013-05-22 DIAGNOSIS — I441 Atrioventricular block, second degree: Secondary | ICD-10-CM

## 2013-05-22 DIAGNOSIS — I10 Essential (primary) hypertension: Secondary | ICD-10-CM

## 2013-05-22 DIAGNOSIS — I5022 Chronic systolic (congestive) heart failure: Secondary | ICD-10-CM

## 2013-05-22 DIAGNOSIS — I350 Nonrheumatic aortic (valve) stenosis: Secondary | ICD-10-CM

## 2013-05-22 LAB — BASIC METABOLIC PANEL
BUN: 18 mg/dL (ref 6–23)
CO2: 31 mEq/L (ref 19–32)
Chloride: 101 mEq/L (ref 96–112)
Glucose, Bld: 93 mg/dL (ref 70–99)
Potassium: 4.4 mEq/L (ref 3.5–5.1)

## 2013-05-22 NOTE — Patient Instructions (Signed)
Your physician recommends that you continue on your current medications as directed. Please refer to the Current Medication list given to you today.  Lab today: [facility]  Your physician wants you to follow-up in: 1 year You will receive a reminder letter in the mail two months in advance. If you don't receive a letter, please call our office to schedule the follow-up appointment.  Monitor your blood pressure and follow a low sodium diet

## 2013-05-22 NOTE — Progress Notes (Signed)
Patient ID: Joshua [name], male   DOB: 1966/05/09, 47 y.o.   MRN: [id number] Past Medical History  Kidney stone, 9/09   hypogonadism, followed by [facility] where he receives injections (Dr Patsi Sears)   ED   Aortic Stenosis, mild, mod Aortic regurg, followed by Dr. 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 [place], [place]  [address] Phone: [phone] Fax:  [phone]  Date:  05/22/2013   ID:  Joshua [name], DOB Jun 29, 1965, MRN [id number]  PCP:  Delphia Grates   ASSESSMENT:  1. Chronic systolic heart failure, stable and asymptomatic 2. Aortic valve disease with AS/AR, mild to moderate 3. Hypertension with borderline control 4. History of first and second-degree AV block  PLAN:  1. Basic metabolic panel today 2. Continue cardio training and active lifestyle 3. Decreased solid diet and weight loss 4. One-year followup at which time we will consider an echocardiogram   SUBJECTIVE: Joshua [name] is a 47 y.o. 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 He is asymptomatic. He has not had syncope or palpitations. He denies chest pain. No medication side effects. No change in exertional tolerance.   Wt Readings from Last 3 Encounters:  05/22/13 288 lb (130.636 kg)  02/21/13 282 lb 6.4 oz (128.096 kg)  01/06/13 277 lb 8 oz (125.873 kg)     Past Medical History  Diagnosis Date  . Hypertension   . Aortic stenosis   . Kidney stone     nephrolithiasis  . Hypogonadism male   . Heart murmur   . Sleep apnea   . Hyperlipidemia   . CHF (congestive heart failure) DEC 2012  . 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 Mendel Ryder   . H/O hiatal hernia   . Arthritis     LEFT KNEE    Current Outpatient Prescriptions  Medication Sig Dispense Refill  . magnesium oxide (MAG-OX) 400 (241.3 MG) MG tablet Take 400 mg by mouth daily as needed.       . naphazoline-pheniramine (NAPHCON-A) 0.025-0.3 % ophthalmic solution Place 1 drop into both eyes as needed. RED EYES      . naproxen sodium (ANAPROX) 220 MG tablet Take 220 mg by mouth 2 (two) times daily with a meal.      . Olmesartan-Amlodipine-HCTZ (TRIBENZOR) 20-5-12.5 MG TABS Take 1 tablet by mouth daily.       Marland Kitchen spironolactone (ALDACTONE) 50 MG tablet Take 25 mg by mouth daily before breakfast.       . testosterone cypionate (DEPOTESTOTERONE CYPIONATE) 200 MG/ML injection Inject into the muscle every 14 (fourteen) days.       . vardenafil (LEVITRA) 20 MG tablet Take 20 mg by mouth daily as needed. ED       No current facility-administered medications for this visit.    Allergies:    Allergies  Allergen Reactions  . Bystolic [Nebivolol Hcl] Shortness Of Breath    Leg swelling,difficulty breathing  . Atenolol Swelling    Erectile dysfunction    Social History:  The patient  reports that he has never smoked. He has never used smokeless tobacco.  He reports that he does not drink alcohol or use illicit drugs.   ROS:  Please see the history of present illness.   Denies chest pain. No orthopnea. No ankle edema. No medication side effects. Also know what his ejection fraction is.   All other systems reviewed and negative.   OBJECTIVE: VS:  BP 130/90  Pulse 80  Ht 6\' 2"  (1.88 m)  Wt 288 lb (130.636 kg)  BMI 36.96 kg/m2 Well nourished, well developed, in no acute distress, young/middle-aged moderately obese HEENT: normal Neck: JVD flat. Carotid bruit bilateral transmitted from the aortic valve  Cardiac:  normal S1, S2; RRR; 2-3/6 crescendo decrescendo systolic and 1-2 of 6 decrescendo diastolic murmurs Lungs:  clear to auscultation bilaterally, no wheezing, rhonchi or  rales Abd: soft, nontender, no hepatomegaly Ext: Edema absent. Pulses 2+ Skin: warm and dry Neuro:  CNs 2-12 intact, no focal abnormalities noted  EKG:  Not performed       Signed, Darci Needle III, MD 05/22/2013 11:36 AM

## 2013-06-16 ENCOUNTER — Ambulatory Visit: Payer: BC Managed Care – PPO | Admitting: *Deleted

## 2013-06-19 ENCOUNTER — Encounter (INDEPENDENT_AMBULATORY_CARE_PROVIDER_SITE_OTHER): Payer: Self-pay | Admitting: Surgery

## 2013-06-19 ENCOUNTER — Ambulatory Visit (INDEPENDENT_AMBULATORY_CARE_PROVIDER_SITE_OTHER): Payer: BC Managed Care – PPO | Admitting: Surgery

## 2013-06-19 DIAGNOSIS — Z9884 Bariatric surgery status: Secondary | ICD-10-CM

## 2013-06-19 NOTE — Progress Notes (Signed)
Re:   Joshua [name] DOB:   11/02/1965 MRN:   [id number]  ASSESSMENT AND PLAN: 1.  Lap band - APS, with hiatal hernia repair - 07/09/2012 - D. Ryen Heitmeyer.  Morbid obesity - Initial weight - 317, BMI - 43.1  No adjustment today.  He has been affected by the death of his sister.  He admits that he is not exercising as well as he was and he is not doing as well with his diet.  We agreed he will work to refocus.  Return to office in 3 months.   2.  Hypertension. 3.  Aortic stenosis.  CHF.  He was hospitalized in Dec. 2012.  He got a cath at that time.   Sees Dr. Katrinka BlazingSmith. Last seen on 05/22/2013. 4.  History of kidney stones. 5.  Sleep apnea  On CPAP 6.  Hyperlipidemia. 7.  Getting testosterone shots q 2 weeks per Dr. Patsi Searsannenbaum 8.  Small hiatal hernia   Repaired at time of lap band  Chief Complaint  Patient presents with  . Bariatric Follow Up   REFERRING PHYSICIAN: GURLEY,Scott, PA-C  HISTORY OF PRESENT ILLNESS: Joshua [name] is a 48 y.o. (DOB: 11/02/1965)  AA male whose primary care physician is GURLEY,Scott, PA-C and comes to me today for follow up of a lap band.  His sister died on 06/12/2013 from breast cancer.  He has gained 20 pounds from his low in 10/2012.  This has clearly focused his focus and work towards his weight loss.  He was exercising about 3 days a week, but has not done as well recently.   (He has an exercise routine of walking -15 minutes, stationary bike - 15 mintues, elliptical - 15 minutes, and 50 sit ups.)    Weight history: The patient has been overweight much of his adult life. He heard Dr. Wenda LowMatt Martin speak at our information session. He is interested in the lap band. He knows some friends who have had weight loss surgery, but he is unsure of the type of surgery they had. He has tried multiple diets including: [employer] [place] diet, low-carb dot, a book called "The skinny and", and vigorous exercise. He was running and successfully lost done to about 250 pounds.  However, he had arthritic problems with his knees which stopped his exercise and he has gained weight back. After hearing Dr. Daphine DeutscherMartin speak of information session he has cut back on sugars and has lost about 10 pounds.  He has no history of stomach disease.  No history of liver disease.  No history of gall bladder disease.  No history of pancreas disease.  No history of colon disease. His most significant past history is he was hospitalized in December 2012 for congestive heart failure. Dr. Garnette ScheuermannHank Smith is his cardiologist.  Past Medical History  Diagnosis Date  . Hypertension   . Aortic stenosis   . Kidney stone     nephrolithiasis  . Hypogonadism male   . Heart murmur   . Sleep apnea   . Hyperlipidemia   . CHF (congestive heart failure) DEC 2012  . 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 Mendel RyderH. SMITH  . H/O hiatal hernia   . Arthritis     LEFT KNEE     Current Outpatient Prescriptions  Medication Sig Dispense Refill  . magnesium oxide (MAG-OX) 400 (241.3 MG) MG tablet Take  400 mg by mouth daily as needed.       . naphazoline-pheniramine (NAPHCON-A) 0.025-0.3 % ophthalmic solution Place 1 drop into both eyes as needed. RED EYES      . naproxen sodium (ANAPROX) 220 MG tablet Take 220 mg by mouth 2 (two) times daily with a meal.      . Olmesartan-Amlodipine-HCTZ (TRIBENZOR) 20-5-12.5 MG TABS Take 1 tablet by mouth daily.       Marland Kitchen spironolactone (ALDACTONE) 50 MG tablet Take 25 mg by mouth daily before breakfast.       . testosterone cypionate (DEPOTESTOTERONE CYPIONATE) 200 MG/ML injection Inject into the muscle every 14 (fourteen) days.       . vardenafil (LEVITRA) 20 MG tablet Take 20 mg by mouth daily as needed. ED       No current facility-administered medications for this visit.     Allergies  Allergen Reactions  . Bystolic [Nebivolol Hcl] Shortness Of Breath    Leg  swelling,difficulty breathing  . Atenolol Swelling    Erectile dysfunction    REVIEW OF SYSTEMS: Cardiac:  Hypertension x 9 years.  CHF.  Aortic stenosis.  Followed by Dr. Mendel Ryder. Pulmonary:  Sleep apnea x 10 years.  Urologic:  History of kidney stones.  Last about 2008. Musculoskeletal:  Knee arthritis, but is not seeing an ortho at this time. Psycho-social:  The patient is oriented.   Saw Dr. Kimber Relic 01/16/2012  SOCIAL and FAMILY HISTORY: Married. Works at [employer] Sister died 07-21-2013.  PHYSICAL EXAM: BP 120/77  Pulse 80  Temp(Src) 98.6 F (37 C) (Temporal)  Resp 16  Ht 6\' 2"  (1.88 m)  Wt 290 lb 9.6 oz (131.815 kg)  BMI 37.29 kg/m2  General: AA obese M who is alert and generally healthy appearing.  Lungs: Clear to auscultation and symmetric breath sounds. Heart:  RRR. 3/6 sternal murmur. Abdomen: Soft.  Normal bowel sounds.  Port okay.  Moderate sized pannus.  Procedure:  No adjustment today.  DATA REVIEWED: None new.  Ovidio Kin, MD,  [facility] [address] [place]  [address]   [place], [place]    [address] Phone:  [phone] FAX:  [phone]

## 2013-07-17 ENCOUNTER — Encounter: Payer: BC Managed Care – PPO | Attending: Surgery | Admitting: Dietician

## 2013-07-17 DIAGNOSIS — Z9884 Bariatric surgery status: Secondary | ICD-10-CM | POA: Insufficient documentation

## 2013-07-17 DIAGNOSIS — E669 Obesity, unspecified: Secondary | ICD-10-CM | POA: Insufficient documentation

## 2013-07-17 DIAGNOSIS — Z713 Dietary counseling and surveillance: Secondary | ICD-10-CM | POA: Insufficient documentation

## 2013-07-17 NOTE — Progress Notes (Signed)
  Follow-up visit:  12 Month Post-Operative LAGB Surgery  Medical Nutrition Therapy:  Appt start time:  1130  End time:  1215.  Primary concerns today: Post-operative Bariatric Surgery Nutrition Management. Jorja Loa returns today with a gain of 14 lbs. Stated that his sister recently passed away and his mother had to be placed in a nursing home so he has been under tremendous stress. He had stopped exercising and was eating whatever was available for several months. Recently started exercising again and is trying to be on track with his diet. Continues to eat excessive carbs at times and can eat large portions of food.    Surgery date: 07/09/12 Surgery type: LAGB [facility] weight at [facility]: 305.0 lbs (12/23/11) Goal weight: None; states "just to be at a healthier weight"  Weight today: 291.0 lbs  Weight change: 14.0 lb GAIN Total weight lost: 27.5 lbs  TANITA  BODY COMP RESULTS  12/23/11 06/27/12 07/23/12 09/03/12 10/07/12 01/06/13 07/17/13   BMI (kg/m^2) 38.1 36.2 34.3 34.2 33.7 35.6 37.4   Fat Mass (lbs) -- 106.5 93.5 90.0 86.0 95.5 114.0   Fat Free Mass (lbs) -- 183.5 181.0 184.0 183.5 182.0 177.0   Total Body Water (lbs) -- 134.5 132.5 134.5 134.5 133.0 129.5   24-hr recall: (works 3p - 11p) B (9-10 AM): 2 [place] sausage patties 1 pkt plain oatmeal w/ honey (20g)  Snk (1:30 PM): 4 oz chicken and broccoli (28 g) L (5:30 PM): Smart One/Lean Cuisine Snk (9 PM):  4 oz hamburger patty (28g) Snk (PM): smart one and 2 oranges (after workout)   Fluid intake: water with Mio and unsweet tea sometimes Diet Coke  64 oz+ Estimated total protein intake: 70-90 g  Medications: See medication list Supplementation: Taking - having trouble taking all the calcium   Using straws: No Drinking while eating: Yes; trying to take baby sips (hiccups and painful if does not) Hair loss: No Carbonated beverages: some Diet Coke "every once in a while" N/V/D/C:  None Last Lap-Band fill:  10/17/12; 1.5 cc removed.    Recent physical activity:  3 days/week @ gym - walking around gym, stationary bike 15 min, 50 sit ups, & ends with elliptical for 10 min.  Rides bike at [facility] when weather is good on Saturdays  Progress Towards Goal(s):  In progress.  Nutritional Diagnosis:  [place]-3.3 Overweight/obesity related to past poor dietary habits and physical inactivity as evidenced by patient w/ recent LAGB surgery following dietary guidelines for continued weight loss.  Monitoring/Evaluation:  Dietary intake, exercise, lap band fills, and body weight. Follow up in 3 months for 15 month post-op visit.

## 2013-07-17 NOTE — Patient Instructions (Addendum)
Goals:  Follow Phase 3B: High Protein + Non-Starchy Vegetables  Continue intake of lean protein foods to meet 80-90g goal  Avoid drinking 15 minutes before, during and 30 minutes after eating  Slow down while eating   Continue exercising 3 x week   Limit carbs to 15 g per meal or snack  

## 2013-09-18 ENCOUNTER — Ambulatory Visit (INDEPENDENT_AMBULATORY_CARE_PROVIDER_SITE_OTHER): Payer: BC Managed Care – PPO | Admitting: Surgery

## 2013-09-18 DIAGNOSIS — Z9884 Bariatric surgery status: Secondary | ICD-10-CM

## 2013-09-18 NOTE — Progress Notes (Signed)
Re:   Joshua [name] DOB:   06/30/1965 MRN:   [id number]  ASSESSMENT AND PLAN: 1.  Lap band - APS, with hiatal hernia repair - 07/09/2012 - D. Freddy Kinne.  Morbid obesity - Initial weight - 317, BMI - 43.1  Doing better about sister's death.  He seems to be in a good routine.  Return to office in 3 months.   2.  Hypertension. 3.  Aortic stenosis.  CHF.  He was hospitalized in Dec. 2012.  He got a cath at that time.   Sees Dr. Katrinka Blazing. Last seen on 05/22/2013. 4.  History of kidney stones. 5.  Sleep apnea  Not using his CPAP machine 6.  Hyperlipidemia. 7.  Getting testosterone shots q 2 weeks per Dr. Patsi Sears 8.  Small hiatal hernia   Repaired at time of lap band  Chief Complaint  Patient presents with  . Lap Band Fill   REFERRING PHYSICIAN: GURLEY,Scott, PA-C  HISTORY OF PRESENT ILLNESS: Joshua [name] is a 48 y.o. (DOB: 1965/10/20)  Joshua [name] whose primary care physician is GURLEY,Scott, PA-C and comes to me today for follow up of a lap band.  He is doing better after his sister's death. He is eating Well[employer] and this is limiting his intake.  He has gotten back into a system and the lap band is providing some resistance. We talked about adjusting his lap band or not.  He a little worried, because we overfilled him in May 2014. Since he has had good weight loss, has some resistance, we will not feed him this time.  I will see him back in 3 months.  The break even point will be 280.  If his weight is aboe 280, we will fill him next time.  If it is less than 280, will probably continue to hold off. He also asked about converting to a sleeve.  But he is doing fairly well and I would be hesitant to convert him at this time.  Weight history: The patient has been overweight much of his adult life. He heard Dr. Wenda Low speak at our information session. He is interested in the lap band. He knows some friends who have had weight loss surgery, but he is unsure of the type of surgery  they had. He has tried multiple diets including: [employer], low-carb dot, a book called "The skinny and", and vigorous exercise. He was running and successfully lost done to about 250 pounds. However, he had arthritic problems with his knees which stopped his exercise and he has gained weight back. After hearing Dr. Daphine Deutscher speak of information session he has cut back on sugars and has lost about 10 pounds.  He has no history of stomach disease.  No history of liver disease.  No history of gall bladder disease.  No history of pancreas disease.  No history of colon disease. His most significant past history is he was hospitalized in December 2012 for congestive heart failure. Dr. Garnette Scheuermann is his cardiologist.  Past Medical History  Diagnosis Date  . Hypertension   . Aortic stenosis   . Kidney stone     nephrolithiasis  . Hypogonadism [name]   . Heart murmur   . Sleep apnea   . Hyperlipidemia   . CHF (congestive heart failure) DEC 2012  . 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  H. SMITH  . H/O hiatal hernia   . Arthritis     LEFT KNEE     Current Outpatient Prescriptions  Medication Sig Dispense Refill  . magnesium oxide (MAG-OX) 400 (241.3 MG) MG tablet Take 400 mg by mouth daily as needed.       . naphazoline-pheniramine (NAPHCON-A) 0.025-0.3 % ophthalmic solution Place 1 drop into both eyes as needed. RED EYES      . naproxen sodium (ANAPROX) 220 MG tablet Take 220 mg by mouth 2 (two) times daily with a meal.      . Olmesartan-Amlodipine-HCTZ (TRIBENZOR) 20-5-12.5 MG TABS Take 1 tablet by mouth daily.       Marland Kitchen. spironolactone (ALDACTONE) 50 MG tablet Take 25 mg by mouth daily before breakfast.       . testosterone cypionate (DEPOTESTOTERONE CYPIONATE) 200 MG/ML injection Inject into the muscle every 14 (fourteen) days.       . vardenafil (LEVITRA) 20 MG tablet Take 20 mg by  mouth daily as needed. ED       No current facility-administered medications for this visit.     Allergies  Allergen Reactions  . Bystolic [Nebivolol Hcl] Shortness Of Breath    Leg swelling,difficulty breathing  . Atenolol Swelling    Erectile dysfunction    REVIEW OF SYSTEMS: Cardiac:  Hypertension x 9 years.  CHF.  Aortic stenosis.  Followed by Dr. Mendel RyderH. Smith. Pulmonary:  Sleep apnea x 10 years.  Urologic:  History of kidney stones.  Last about 2008. Musculoskeletal:  Knee arthritis, but is not seeing an ortho at this time. Psycho-social:  The patient is oriented.   Saw Dr. 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 Sister died 06/2013.  This affected him adversely, but he is doing better.  PHYSICAL EXAM: BP 134/78  Pulse 78  Temp(Src) 98 F (36.7 C)  Resp 18  Ht 6\' 3"  (1.905 m)  Wt 282 lb 3.2 oz (128.005 kg)  BMI 35.27 kg/m2  General: Joshua obese M who is alert and generally healthy appearing.  Lungs: Clear to auscultation and symmetric breath sounds. Heart:  RRR. 3/6 sternal murmur. Abdomen: Soft.  Normal bowel sounds.  Port okay.  Moderate sized pannus.  Procedure:  No adjustment today.  DATA REVIEWED: None new.  Ovidio Kinavid Ziyon Cedotal, MD,  [facility] [address]  [address]   [place], [place]    [id number] Phone:  [phone] FAX:  [phone]

## 2013-10-05 ENCOUNTER — Other Ambulatory Visit: Payer: Self-pay | Admitting: Interventional Cardiology

## 2013-10-16 ENCOUNTER — Encounter: Payer: BC Managed Care – PPO | Attending: Surgery | Admitting: Dietician

## 2013-10-16 DIAGNOSIS — E669 Obesity, unspecified: Secondary | ICD-10-CM | POA: Insufficient documentation

## 2013-10-16 DIAGNOSIS — Z713 Dietary counseling and surveillance: Secondary | ICD-10-CM | POA: Insufficient documentation

## 2013-10-16 DIAGNOSIS — Z9884 Bariatric surgery status: Secondary | ICD-10-CM | POA: Insufficient documentation

## 2013-10-16 NOTE — Patient Instructions (Signed)
Goals:  Follow Phase 3B: High Protein + Non-Starchy Vegetables  Continue intake of lean protein foods to meet 80-90g goal  Avoid drinking 15 minutes before, during and 30 minutes after eating  Slow down while eating   Continue exercising 3 x week   Limit carbs to 15 g per meal or snack

## 2013-10-16 NOTE — Progress Notes (Signed)
  Follow-up visit:  15 Month Post-Operative LAGB Surgery  Medical Nutrition Therapy:  Appt start time:  1130  End time:  1200.  Primary concerns today: Post-operative Bariatric Surgery Nutrition Management. Jorja Loa returns today with a 6.5 lb loss and 16 lb fat loss. Works out 3 x week and eating a lot of WellPoint and 100 calorie Yoplait. Having a "cheat day" every once in a while (on weekends).   Surgery date: 07/09/12 Surgery type: LAGB Start weight at [facility]: 305.0 lbs (12/23/11) Goal weight: None; states "just to be at a healthier weight"  Weight today: 284.5 lbs  Weight change: 6.5 lbs loss, 16 lbs fat loss Total weight lost: 34 lbs  TANITA  BODY COMP RESULTS  12/23/11 06/27/12 07/23/12 09/03/12 10/07/12 01/06/13 07/17/13 10/16/13   BMI (kg/m^2) 38.1 36.2 34.3 34.2 33.7 35.6 37.4 36.5   Fat Mass (lbs) -- 106.5 93.5 90.0 86.0 95.5 114.0 98.0   Fat Free Mass (lbs) -- 183.5 181.0 184.0 183.5 182.0 177.0 186.5   Total Body Water (lbs) -- 134.5 132.5 134.5 134.5 133.0 129.5 136.5   24-hr recall: (works 3p - 11p) B (9-10 AM): 2 [place] sausage patties 1 pkt plain oatmeal w/ honey (20g) or skips Snk (1:30 PM): 4 oz chicken and broccoli (28 g) L (5:30 PM): yogurt with trail mix (13 g) or yogurt covered pretzels Snk (9 PM):  Lean cusine and yogurt (28g) Snk (PM): smart one  (14 g)   Fluid intake: 64 oz water with Mio and unsweet tea sometimes Diet ginger ale  64 oz+ Estimated total protein intake: 80-90 g  Medications: See medication list Supplementation: Taking   Using straws: No Drinking while eating: Yes; trying to take baby sips (hiccups and painful if does not) Hair loss: No Carbonated beverages: some Diet ginger ale "every once in a while" N/V/D/C:  None Last Lap-Band fill:  10/17/12; 1.5 cc removed.   Recent physical activity:  3 days/week @ gym - walking around gym, stationary bike 15 min, 50 sit ups, & ends with elliptical for 10 min.  Rides bike at [facility] when weather is good  on Saturdays  Progress Towards Goal(s):  In progress.  Nutritional Diagnosis:  [place]-3.3 Overweight/obesity related to past poor dietary habits and physical inactivity as evidenced by patient w/ recent LAGB surgery following dietary guidelines for continued weight loss.  Monitoring/Evaluation:  Dietary intake, exercise, lap band fills, and body weight. Follow up in 3 months for 18 month post-op visit.

## 2013-12-18 ENCOUNTER — Ambulatory Visit (INDEPENDENT_AMBULATORY_CARE_PROVIDER_SITE_OTHER): Payer: BC Managed Care – PPO | Admitting: Surgery

## 2013-12-18 ENCOUNTER — Encounter (INDEPENDENT_AMBULATORY_CARE_PROVIDER_SITE_OTHER): Payer: Self-pay | Admitting: Surgery

## 2013-12-18 VITALS — BP 126/78 | HR 67 | Temp 98.0°F | Ht 75.0 in | Wt 286.0 lb

## 2013-12-18 DIAGNOSIS — Z9884 Bariatric surgery status: Secondary | ICD-10-CM

## 2013-12-18 NOTE — Progress Notes (Signed)
Re:   Joshua [name] DOB:   1965-11-25 MRN:   [id number]  ASSESSMENT AND PLAN: 1.  Lap band - APS, with hiatal hernia repair - 07/09/2012 - D. Clark Cuff.  Morbid obesity - Initial weight - 317, BMI - 43.1  I added 1.0 cc to his lap band for a total of 4.0 cc.  Return to office in 3 months.   2.  Hypertension. 3.  Aortic stenosis.  CHF.  He was hospitalized in Dec. 2012.  He got a cath at that time.  Sees Dr. Katrinka Blazing.  4.  History of kidney stones. 5.  Sleep apnea  Not using his CPAP machine 6.  Hyperlipidemia. 7.  Getting testosterone shots q 2 weeks per Dr. Patsi Sears 8.  Small hiatal hernia   Repaired at time of lap band  Chief Complaint  Patient presents with  . Bariatric Follow Up   REFERRING PHYSICIAN: GURLEY,Scott, PA-C  HISTORY OF PRESENT ILLNESS: Joshua [name] is a 48 y.o. (DOB: 1965/06/30)  AA male whose primary care physician is JoshuaScott, PA-C and comes to me today for follow up of a lap band. He came by himself. He admits to "falling off the wagon" during the July 4 weekend. He is doing well with his diet.  He is eating WellPoint and this is limiting his intake.    He has oatmeal and toast for breakfast and does fairly well with this without significant resistance.  He is doing cardio exercise (walking, sit ups, stationary bike) about 1 hour 3 times per week.  Weight history: The patient has been overweight much of his adult life. He heard Dr. Wenda [name] speak at our information session. He is interested in the lap band. He knows some friends who have had weight loss surgery, but he is unsure of the type of surgery they had. He has tried multiple diets including: [employer], [name]-carb dot, a book called "The skinny and", and vigorous exercise. He was running and successfully lost done to about 250 pounds. However, he had arthritic problems with his knees which stopped his exercise and he has gained weight back. After hearing Dr. Daphine [name] speak of  information session he has cut back on sugars and has lost about 10 pounds.  He has no history of stomach disease.  No history of liver disease.  No history of gall bladder disease.  No history of pancreas disease.  No history of colon disease. His most significant past history is he was hospitalized in December 2012 for congestive heart failure. Dr. Garnette Scheuermann is his cardiologist.  Past Medical History  Diagnosis Date  . Hypertension   . Aortic stenosis   . Kidney stone     nephrolithiasis  . Hypogonadism male   . Heart murmur   . Sleep apnea   . Hyperlipidemia   . CHF (congestive heart failure) DEC 2012  . 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 Mendel [name]  . H/O hiatal hernia   . Arthritis     LEFT KNEE     Current Outpatient Prescriptions  Medication Sig Dispense Refill  . aspirin 81 MG tablet Take 81 mg by mouth daily.      . magnesium oxide (MAG-OX) 400 (241.3 MG) MG tablet Take 400 mg by mouth daily as needed.       . naphazoline-pheniramine (NAPHCON-A) 0.025-0.3 % ophthalmic  solution Place 1 drop into both eyes as needed. RED EYES      . naproxen sodium (ANAPROX) 220 MG tablet Take 220 mg by mouth 2 (two) times daily with a meal.      . spironolactone (ALDACTONE) 25 MG tablet TAKE 1 TABLET BY MOUTH ONCE DAILY  30 tablet  5  . spironolactone (ALDACTONE) 50 MG tablet Take 25 mg by mouth daily before breakfast.       . testosterone cypionate (DEPOTESTOTERONE CYPIONATE) 200 MG/ML injection Inject into the muscle every 14 (fourteen) days.       Marya Landry. TRIBENZOR 20-5-12.5 MG TABS TAKE 1 TABLET BY MOUTH EVERY MORNING  30 tablet  5  . vardenafil (LEVITRA) 20 MG tablet Take 20 mg by mouth daily as needed. ED       No current facility-administered medications for this visit.     Allergies  Allergen Reactions  . Bystolic [Nebivolol Hcl] Shortness Of Breath    Leg swelling,difficulty  breathing  . Atenolol Swelling    Erectile dysfunction    REVIEW OF SYSTEMS: Cardiac:  Hypertension x 9 years.  CHF.  Aortic stenosis.  Followed by Dr. Mendel RyderH. Smith. Pulmonary:  Sleep apnea x 10 years.  Urologic:  History of kidney stones.  Last about 2008. Musculoskeletal:  Knee arthritis, but is not seeing an ortho at this time. Psycho-social:  The patient is oriented.   Saw Dr. 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 Sister died 06/2013.  This affected him adversely, but he is doing better.  PHYSICAL EXAM: BP 126/78  Pulse 67  Temp(Src) 98 F (36.7 C)  Ht 6\' 3"  (1.905 m)  Wt 286 lb (129.729 kg)  BMI 35.75 kg/m2  General: AA obese M who is alert and generally healthy appearing.  Lungs: Clear to auscultation and symmetric breath sounds. Heart:  RRR. 3/6 sternal murmur. Abdomen: Soft.  Normal bowel sounds.  Port okay.  Moderate sized pannus.  Procedure:  I accessed his lap band.  He had 3.0 cc, I added 1.0 cc for a total of 4.0 cc in the lap band.  DATA REVIEWED: None new.  Joshua Kinavid Dewitte Vannice, MD,  [facility] [address]  [address]   [place], [place]    [id number] Phone:  [phone] FAX:  [phone]

## 2014-01-12 ENCOUNTER — Telehealth: Payer: Self-pay | Admitting: Interventional Cardiology

## 2014-01-12 MED ORDER — OLMESARTAN-AMLODIPINE-HCTZ 20-5-12.5 MG PO TABS: ORAL_TABLET | ORAL | Status: DC

## 2014-01-12 NOTE — Telephone Encounter (Signed)
New message      Refill tribenzor.  He said they need authorization from Dr Katrinka Blazing. Patient has refills on the bottle.  [employer]

## 2014-01-12 NOTE — Telephone Encounter (Signed)
Patient states he needs refill on Tribenzor. States the pharmacist has not had a response on the refill request. Refill completed. Notified patient that refill went through electronically.

## 2014-01-16 ENCOUNTER — Telehealth: Payer: Self-pay

## 2014-01-16 NOTE — Telephone Encounter (Signed)
Patient called to get samples of tribenzor placed samples up front

## 2014-01-19 ENCOUNTER — Encounter: Payer: BC Managed Care – PPO | Attending: Surgery | Admitting: Dietician

## 2014-01-19 DIAGNOSIS — Z713 Dietary counseling and surveillance: Secondary | ICD-10-CM | POA: Diagnosis present

## 2014-01-19 DIAGNOSIS — Z9884 Bariatric surgery status: Secondary | ICD-10-CM | POA: Insufficient documentation

## 2014-01-19 DIAGNOSIS — E669 Obesity, unspecified: Secondary | ICD-10-CM | POA: Insufficient documentation

## 2014-01-19 NOTE — Progress Notes (Signed)
  Follow-up visit:  Post-Operative LAGB Surgery  Medical Nutrition Therapy:  Appt start time:  1130  End time:  1200.  Primary concerns today: Post-operative Bariatric Surgery Nutrition Management. Joshua [name] returns today with a 3 lbs weight gain. Still having a lot of Lean Cuisine and still having a cheat day once in a while. Still doing about 3 days of activity though did not exercise last week d/t car problems.  Would like to lose a little more but overall ok if he doesn't lose more. Eating slower than before his fill in July. Trying to decide if is in the green zone.   Surgery date: 07/09/12 Surgery type: LAGB Start weight at [facility]: 305.0 lbs (12/23/11) 330 lbs at heaviest weight per pt Goal weight: None; states "just to be at a healthier weight"  Weight today: 287.5 Weight change: 3 lbs gain Total weight lost: 57.5 lbs  TANITA  BODY COMP RESULTS  12/23/11 06/27/12 07/23/12 09/03/12 10/07/12 01/06/13 07/17/13 10/16/13 01/19/14   BMI (kg/m^2) 38.1 36.2 34.3 34.2 33.7 35.6 37.4 36.5 36.9   Fat Mass (lbs) -- 106.5 93.5 90.0 86.0 95.5 114.0 98.0 100.5   Fat Free Mass (lbs) -- 183.5 181.0 184.0 183.5 182.0 177.0 186.5 187.0   Total Body Water (lbs) -- 134.5 132.5 134.5 134.5 133.0 129.5 136.5 137.0   24-hr recall: (works 3p - 11p) B (9-10 AM): 2 [place] sausage patties 1 pkt plain oatmeal w/ honey (20g) or skips 2-3 x week Snk (1:30 PM): 4 oz chicken and broccoli with rice (Chinese) (28 g) L (5:30 PM): yogurt (13 g)  Snk (9 PM):  Lean cusine and yogurt (28g) Snk (PM): smart one  (14 g) or oatmeal   Fluid intake: 64 oz water with Mio and unsweet tea sometimes Diet ginger ale  64 oz+ Estimated total protein intake: 60-90 g  Medications: See medication list Supplementation: Taking   Using straws: every once in a while Drinking while eating: Yes; trying to take baby sips (hiccups and painful if does not) Hair loss: No Carbonated beverages: some Diet ginger ale N/V/D/C:  None Last Lap-Band  fill:  12/18/13 1.0 cc added, for a total of 4cc  Recent physical activity:  3 days/week @ gym - walking around gym, stationary bike 15 min, 50 sit ups, & ends with elliptical for 10 min.  Rides bike at [facility] when weather is good on Saturdays  Progress Towards Goal(s):  In progress.  Nutritional Diagnosis:  [place]-3.3 Overweight/obesity related to past poor dietary habits and physical inactivity as evidenced by patient w/ recent LAGB surgery following dietary guidelines for continued weight loss.  Monitoring/Evaluation:  Dietary intake, exercise, lap band fills, and body weight. Follow up in 3 months for 21 month post-op visit.

## 2014-01-19 NOTE — Patient Instructions (Addendum)
Goals:  Follow Phase 3B: High Protein + Non-Starchy Vegetables  Continue intake of lean protein foods to meet 80-90g goal  Eat breakfast or have a protein shake each morning.   If you want to keep losing weight, avoid carbs such as honey, oatmeal, and rice. Eat just protein and vegetables.   Avoid drinking 15 minutes before, during and 30 minutes after eating  Continue to slow down while eating (stop eating at 30 minute mark).  Continue exercising 3 x week

## 2014-02-06 NOTE — Progress Notes (Signed)
Patient ID: YASSINE CENTER, male   DOB: [birth year], 48 y.o.   MRN: [id number] Faxed in a PA for tribenzor to [facility] [place]

## 2014-02-13 ENCOUNTER — Telehealth: Payer: Self-pay | Admitting: *Deleted

## 2014-02-13 NOTE — Telephone Encounter (Signed)
Tribenzor samples placed at the front desk for pick up.

## 2014-03-10 ENCOUNTER — Telehealth: Payer: Self-pay

## 2014-03-10 NOTE — Telephone Encounter (Signed)
Patient called for samples of tribenzor I  placed 4 pks of samples up front and I also lm on voice mail

## 2014-03-25 ENCOUNTER — Telehealth: Payer: Self-pay

## 2014-03-25 NOTE — Telephone Encounter (Signed)
error 

## 2014-03-29 ENCOUNTER — Other Ambulatory Visit: Payer: Self-pay | Admitting: Interventional Cardiology

## 2014-04-07 ENCOUNTER — Telehealth: Payer: Self-pay

## 2014-04-07 NOTE — Telephone Encounter (Signed)
error 

## 2014-04-13 ENCOUNTER — Telehealth: Payer: Self-pay | Admitting: *Deleted

## 2014-04-13 NOTE — Telephone Encounter (Signed)
Tribenzor samples placed at the front desk for patient.

## 2014-04-23 ENCOUNTER — Ambulatory Visit: Payer: BC Managed Care – PPO | Admitting: Dietician

## 2014-05-04 ENCOUNTER — Ambulatory Visit (INDEPENDENT_AMBULATORY_CARE_PROVIDER_SITE_OTHER): Payer: BC Managed Care – PPO | Admitting: Interventional Cardiology

## 2014-05-04 ENCOUNTER — Encounter: Payer: Self-pay | Admitting: Interventional Cardiology

## 2014-05-04 VITALS — BP 100/84 | HR 63 | Ht 75.0 in | Wt 293.4 lb

## 2014-05-04 DIAGNOSIS — I359 Nonrheumatic aortic valve disorder, unspecified: Secondary | ICD-10-CM

## 2014-05-04 DIAGNOSIS — I441 Atrioventricular block, second degree: Secondary | ICD-10-CM

## 2014-05-04 DIAGNOSIS — I10 Essential (primary) hypertension: Secondary | ICD-10-CM

## 2014-05-04 MED ORDER — VALSARTAN-HYDROCHLOROTHIAZIDE 320-12.5 MG PO TABS: 1.0000 | ORAL_TABLET | Freq: Every day | ORAL | Status: DC

## 2014-05-04 NOTE — Patient Instructions (Addendum)
Your physician has recommended you make the following change in your medication:  1) COMPLETE Tribenzor 2) START Diovan Hct 320-12.5mg  daily. An Rx has been sent to your pharmacy  Your physician recommends that you return for lab work in: 2-3 weeks after starting Diovan  Your physician recommends that you schedule a follow-up appointment in: 4-6 weeks with PA/NP for a bp check  Your physician has requested that you have an echocardiogram. Echocardiography is a painless test that uses sound waves to create images of your heart. It provides your doctor with information about the size and shape of your heart and how well your heart's chambers and valves are working. This procedure takes approximately one hour. There are no restrictions for this procedure.  Your physician wants you to follow-up in: 12 months with Dr.Smith You will receive a reminder letter in the mail two months in advance. If you don't receive a letter, please call our office to schedule the follow-up appointment.

## 2014-05-04 NOTE — Progress Notes (Signed)
Patient ID: Joshua Coryimothy L Papadopoulos, male   DOB: 12-Oct-1965, 48 y.o.   MRN: [id number]    [address] [place], [place]  [id number] Phone: [phone] Fax:  [phone]  Date:  05/04/2014   ID:  Joshua Coryimothy L Standifer, DOB 12-Oct-1965, MRN [id number]  PCP:  Delphia GratesGURLEY,Scott, PA-C   ASSESSMENT:  1. 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 Chronic combined systolic and diastolic heart failure 3. Hypertension, essential 4. 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 Prior history of [place] 1  PLAN:  1. 2-D Doppler echocardiogram to assess and follow aortic stenosis 2. Because of insurance coverage requirements, we are having to discontinue Tribenzor and will switch to Diovan HCT 320/12.5 mg. I will not resume amlodipine. We will continue spiral lactone 25 mg per day. 3. To 3 weeks after changing to Diovan HCT, a basic metabolic panel will be obtained to make sure the renal function and potassium are stable 4. Extend our nurse visit for BP check 1 month after starting Diovan HCT 5. I will otherwise see the patient back in one year    SUBJECTIVE: Joshua [name] is a 48 y.o. male who is doing well. His insurance [employer] longer pay for [employer]. He denies dyspnea. He has had no episodes of syncope or palpitation. Overall he is pleased with his current condition.  Wt Readings from Last 3 Encounters:  05/04/14 293 lb 6.4 oz (133.085 kg)  12/18/13 286 lb (129.729 kg)  10/16/13 284 lb 8 oz (129.048 kg)     Past Medical History  Diagnosis Date  . Hypertension   . Aortic stenosis   . Kidney stone     nephrolithiasis  . Hypogonadism male   . Heart murmur   . Sleep apnea   . Hyperlipidemia   . CHF (congestive heart failure) DEC 2012  . 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 Mendel RyderH. Vernell Back  . H/O hiatal hernia   . Arthritis    LEFT KNEE    Current Outpatient Prescriptions  Medication Sig Dispense Refill  . aspirin 81 MG tablet Take 81 mg by mouth daily.    . magnesium oxide (MAG-OX) 400 (241.3 MG) MG tablet Take 400 mg by mouth daily as needed.     . naphazoline-pheniramine (NAPHCON-A) 0.025-0.3 % ophthalmic solution Place 1 drop into both eyes as needed. RED EYES    . naproxen sodium (ANAPROX) 220 MG tablet Take 220 mg by mouth 2 (two) times daily with a meal.    . Olmesartan-Amlodipine-HCTZ (TRIBENZOR) 20-5-12.5 MG TABS TAKE 1 TABLET BY MOUTH EVERY MORNING 30 tablet 5  . spironolactone (ALDACTONE) 25 MG tablet TAKE 1 TABLET BY MOUTH DAILY 30 tablet 3  . testosterone cypionate (DEPOTESTOTERONE CYPIONATE) 200 MG/ML injection Inject into the muscle every 14 (fourteen) days.     . vardenafil (LEVITRA) 20 MG tablet Take 20 mg by mouth daily as needed. ED     No current facility-administered medications for this visit.    Allergies:    Allergies  Allergen Reactions  . Bystolic [Nebivolol Hcl] Shortness Of Breath    Leg swelling,difficulty breathing  . Atenolol Swelling    Erectile dysfunction    Social History:  The patient  reports that he has never smoked. He has never used smokeless tobacco. He reports that he does not drink alcohol  or use illicit drugs.   ROS:  Please see the history of present illness.   Episodes of syncope, edema, orthopnea, PND, prolonged palpitations, or transient neurological symptoms.   All other systems reviewed and negative.   OBJECTIVE: VS:  BP 100/84 mmHg  Pulse 63  Ht 6\' 3"  (1.905 m)  Wt 293 lb 6.4 oz (133.085 kg)  BMI 36.67 kg/m2 Well nourished, well developed, in no acute distress, moderate obesity, young African-American male  HEENT: normal Neck: JVD flat. Carotid bruit bilateral bruits transmitted from the aorta  Cardiac:  normal S1, S2; RRR; no murmur. 2 to 3/6 systolic murmur right upper sternal border. No diastolic murmur.  Lungs:  clear to auscultation  bilaterally, no wheezing, rhonchi or rales Abd: soft, nontender, no hepatomegaly Ext: Edema none. Pulses 2+ and symmetric  Skin: warm and dry Neuro:  CNs 2-12 intact, no focal abnormalities noted  EKG:  Normal sinus rhythm with first-degree AV block       Signed, Darci Needle III, MD 05/04/2014 11:38 AM

## 2014-05-15 ENCOUNTER — Ambulatory Visit (HOSPITAL_COMMUNITY): Payer: BC Managed Care – PPO | Attending: Cardiology | Admitting: Radiology

## 2014-05-15 DIAGNOSIS — I359 Nonrheumatic aortic valve disorder, unspecified: Secondary | ICD-10-CM | POA: Diagnosis present

## 2014-05-15 NOTE — Progress Notes (Signed)
Echocardiogram performed.  

## 2014-05-21 ENCOUNTER — Encounter (HOSPITAL_COMMUNITY): Payer: Self-pay | Admitting: Interventional Cardiology

## 2014-05-22 ENCOUNTER — Other Ambulatory Visit (INDEPENDENT_AMBULATORY_CARE_PROVIDER_SITE_OTHER): Payer: BC Managed Care – PPO | Admitting: *Deleted

## 2014-05-22 DIAGNOSIS — I10 Essential (primary) hypertension: Secondary | ICD-10-CM

## 2014-05-22 LAB — BASIC METABOLIC PANEL
BUN: 21 mg/dL (ref 6–23)
CHLORIDE: 102 meq/L (ref 96–112)
CO2: 30 mEq/L (ref 19–32)
Calcium: 9.3 mg/dL (ref 8.4–10.5)
Creatinine, Ser: 1.4 mg/dL (ref 0.4–1.5)
GFR: 70.06 mL/min (ref >60.00)
Glucose, Bld: 95 mg/dL (ref 70–99)
POTASSIUM: 4.1 meq/L (ref 3.5–5.1)
Sodium: 136 mEq/L (ref 135–145)

## 2014-05-28 ENCOUNTER — Telehealth: Payer: Self-pay

## 2014-05-28 NOTE — Telephone Encounter (Signed)
Pt aware of echo results.Overall LV function is normal. Wall thickness is normal. Aortic valve is mildly abnormal. The aortic size is normal. Overall this is an improvement.pt verbalized understanding.

## 2014-05-28 NOTE — Telephone Encounter (Signed)
-----   Message from Lyn Records III, MD sent at 05/27/2014  8:09 PM EST ----- Overall LV function is normal. Wall thickness is normal. Aortic valve is mildly abnormal. The aortic size is normal. Overall this is an improvement.

## 2014-05-28 NOTE — Telephone Encounter (Signed)
Follow Up ° ° °Pt calling returning call from earlier. Please call. °

## 2014-05-28 NOTE — Telephone Encounter (Signed)
-----   Message from Henry W Smith III, MD sent at 05/27/2014  8:09 PM EST ----- Overall LV function is normal. Wall thickness is normal. Aortic valve is mildly abnormal. The aortic size is normal. Overall this is an improvement. 

## 2014-05-28 NOTE — Telephone Encounter (Signed)
called to give pt echo results.lmtcb 

## 2014-06-19 ENCOUNTER — Encounter: Payer: Self-pay | Admitting: Physician Assistant

## 2014-06-19 ENCOUNTER — Ambulatory Visit (INDEPENDENT_AMBULATORY_CARE_PROVIDER_SITE_OTHER): Payer: BLUE CROSS/BLUE SHIELD | Admitting: Physician Assistant

## 2014-06-19 VITALS — BP 122/90 | HR 85 | Ht 75.0 in | Wt 296.0 lb

## 2014-06-19 DIAGNOSIS — I429 Cardiomyopathy, unspecified: Secondary | ICD-10-CM

## 2014-06-19 DIAGNOSIS — I5042 Chronic combined systolic (congestive) and diastolic (congestive) heart failure: Secondary | ICD-10-CM

## 2014-06-19 DIAGNOSIS — I428 Other cardiomyopathies: Secondary | ICD-10-CM

## 2014-06-19 DIAGNOSIS — I10 Essential (primary) hypertension: Secondary | ICD-10-CM

## 2014-06-19 DIAGNOSIS — I359 Nonrheumatic aortic valve disorder, unspecified: Secondary | ICD-10-CM

## 2014-06-19 MED ORDER — VALSARTAN-HYDROCHLOROTHIAZIDE 320-25 MG PO TABS: 1.0000 | ORAL_TABLET | Freq: Every day | ORAL | Status: DC

## 2014-06-19 NOTE — Progress Notes (Signed)
Cardiology Office Note   Date:  06/19/2014   ID:  Joshua [name], DOB 12-05-1965, MRN [id number]  PCP:  Delphia Grates  Cardiologist:  Dr. Verdis Prime     History of Present Illness: Joshua [name] is a 49 y.o. male with a hx of bicuspid aortic valve disease with mild stenosis, combined systolic and diastolic CHF NICM with prior EF 40-45% (improved to normal by echo in 05/2014), 2nd degree AVB resolved off of beta blockers, HTN, OSA.  Last seen by Dr. Verdis Prime 04/2014.  FU echo demonstrated normal LVF and mild AS.   His medications were adjusted due to insurance changes.  He returns for FU on BP.  The patient denies chest pain, shortness of breath, syncope, orthopnea, PND or significant pedal edema. BP at home tends to run in the 140s.    Studies:   - LHC (12/12):  Patent but Ectatic LAD and RCA (? 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    - Echo (12/15):  EF 55-60%, normal WM, Gr 1 DD, mild AS, mild AI, mean AV 18 mmHg   Recent Labs: 05/22/2014: BUN 21; Creatinine 1.4; Potassium 4.1; Sodium 136   Estimated Creatinine Clearance: 97.4 mL/min (by C-G formula based on Cr of 1.4).    Recent Radiology: No results found.    Wt Readings from Last 3 Encounters:  06/19/14 296 lb (134.265 kg)  05/04/14 293 lb 6.4 oz (133.085 kg)  12/18/13 286 lb (129.729 kg)     Past Medical History  Diagnosis Date  . Hypertension   . Aortic stenosis   . Kidney stone     nephrolithiasis  . Hypogonadism male   . Heart murmur   . Sleep apnea   . Hyperlipidemia   . CHF (congestive heart failure) DEC 2012  . 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 Mendel Ryder  . H/O hiatal hernia   . Arthritis     LEFT KNEE    Current Outpatient Prescriptions  Medication Sig Dispense Refill  . aspirin 81 MG tablet Take 81 mg by mouth daily.    . magnesium oxide (MAG-OX) 400 (241.3 MG)  MG tablet Take 400 mg by mouth daily as needed.     . naphazoline-pheniramine (NAPHCON-A) 0.025-0.3 % ophthalmic solution Place 1 drop into both eyes as needed. RED EYES    . naproxen sodium (ANAPROX) 220 MG tablet Take 220 mg by mouth 2 (two) times daily with a meal.    . spironolactone (ALDACTONE) 25 MG tablet TAKE 1 TABLET BY MOUTH DAILY 30 tablet 3  . testosterone cypionate (DEPOTESTOTERONE CYPIONATE) 200 MG/ML injection Inject into the muscle every 14 (fourteen) days.     . valsartan-hydrochlorothiazide (DIOVAN HCT) 320-12.5 MG per tablet Take 1 tablet by mouth daily. 30 tablet 11  . vardenafil (LEVITRA) 20 MG tablet Take 20 mg by mouth daily as needed. ED     No current facility-administered medications for this visit.     Allergies:   Bystolic and Atenolol   Social History:  The patient  reports that he has never smoked. He has never used smokeless tobacco. He reports that he does not drink alcohol or use illicit drugs.   Family History:  The patient's family history includes Cancer in his sister; Heart disease in his father; Hypertension in his brother; Stroke in his mother. There is no history  of Heart attack.    ROS:  Please see the history of present illness.      All other systems reviewed and negative.    PHYSICAL EXAM: VS:  BP 122/90 mmHg  Pulse 85  Ht  (1.905 m)  Wt 296 lb (134.265 kg)  BMI 37.00 kg/m2  SpO2 95% Well nourished, well developed, in no acute distress HEENT: normal Neck: no JVD Cardiac:  normal S1, S2;  RRR; 2/6 systolic murmur RUSB Lungs:   clear to auscultation bilaterally, no wheezing, rhonchi or rales Abd: soft, nontender, no hepatomegaly Ext: no edema Skin: warm and dry Neuro:  CNs 2-12 intact, no focal abnormalities noted      ASSESSMENT AND PLAN:  1.  Hypertension:  Fair control.  He does admit to eating too much salt.  I have asked him to reduce this as much as possible.      -  I will increase his Diovan/HCT to 320/25 mg QD.      -   Check BMET one week later.      -  He will monitor his BPs and send a couple weeks of recordings with the new dose of Diovan.      -  If still elevated, add Amlodipine.   2.  Non-Ischemic CM:  EF improved to normal by recent echo.  Continue ARB.  He is no longer on beta blocker 2/2 bradycardia.   3.  Aortic Stenosis:  Mild by recent echo. 4.  Combined Systolic and Diastolic CHF:  Volume stable   Disposition:   FU with Dr. Verdis Prime as planned.    Signed, Brynda Rim, MHS 06/19/2014 11:58 AM    [facility] [address] [place], [place], [place]  [address] Phone: [phone]; Fax: [phone]

## 2014-06-19 NOTE — Patient Instructions (Signed)
Your physician has recommended you make the following change in your medication:   1. FINISH YOUR CURRENT BOTTLE OF DIOVAN 2. START DIOVAN/HCT 320/25 MG 1 TABLET DAILY AFTER YOU FINISH YOUR CURRENT DIOVAN BOTTLE  YOU WILL NEED TO CALL THE OFFICE TO SCHEDULE LAB WORK TO BE DONE 1 WEEK  AFTER YOU HAVE STARTED THE INCREASED DOSE OF DIOVAN  ONCE YOU HAVE BEEN ON THE NEW DOSE OF DIOVAN FOR 2 WEEKS PLEASE SEND BLOOD PRESSURE READINGS TO [facility], PAC VIA MY CHART  KEEP YOUR FOLLOW UP WITH DR. Katrinka Blazing AS PLANNED

## 2014-07-29 ENCOUNTER — Telehealth: Payer: Self-pay | Admitting: *Deleted

## 2014-07-29 ENCOUNTER — Encounter: Payer: Self-pay | Admitting: Physician Assistant

## 2014-07-29 NOTE — Telephone Encounter (Signed)
lmptcb to schedule lab appt. Pt was supposed to come back in 06/2014 for repeat bmet due to med change but did not have lab done.

## 2014-08-11 ENCOUNTER — Other Ambulatory Visit: Payer: Self-pay | Admitting: Interventional Cardiology

## 2014-08-19 ENCOUNTER — Other Ambulatory Visit: Payer: BLUE CROSS/BLUE SHIELD

## 2014-08-20 ENCOUNTER — Other Ambulatory Visit (INDEPENDENT_AMBULATORY_CARE_PROVIDER_SITE_OTHER): Payer: BLUE CROSS/BLUE SHIELD | Admitting: *Deleted

## 2014-08-20 DIAGNOSIS — I10 Essential (primary) hypertension: Secondary | ICD-10-CM

## 2014-08-20 LAB — BASIC METABOLIC PANEL
BUN: 15 mg/dL (ref 6–23)
CHLORIDE: 103 meq/L (ref 96–112)
CO2: 31 meq/L (ref 19–32)
Calcium: 9.3 mg/dL (ref 8.4–10.5)
Creatinine, Ser: 1.35 mg/dL (ref 0.40–1.50)
GFR: 72.99 mL/min (ref >60.00)
GLUCOSE: 102 mg/dL — AB (ref 70–99)
POTASSIUM: 4.2 meq/L (ref 3.5–5.1)
Sodium: 137 mEq/L (ref 135–145)

## 2015-01-04 ENCOUNTER — Encounter: Payer: Self-pay | Admitting: Dietician

## 2015-01-04 ENCOUNTER — Encounter: Payer: BLUE CROSS/BLUE SHIELD | Attending: Surgery | Admitting: Dietician

## 2015-01-04 DIAGNOSIS — Z713 Dietary counseling and surveillance: Secondary | ICD-10-CM | POA: Insufficient documentation

## 2015-01-04 DIAGNOSIS — Z9884 Bariatric surgery status: Secondary | ICD-10-CM | POA: Insufficient documentation

## 2015-01-04 DIAGNOSIS — Z48815 Encounter for surgical aftercare following surgery on the digestive system: Secondary | ICD-10-CM | POA: Insufficient documentation

## 2015-01-04 DIAGNOSIS — Z6838 Body mass index (BMI) 38.0-38.9, adult: Secondary | ICD-10-CM | POA: Diagnosis not present

## 2015-01-04 NOTE — Progress Notes (Signed)
  Follow-up visit:  2.5 years Post-Operative LAGB Surgery  Medical Nutrition Therapy:  Appt start time:  940  End time:  1015  Primary concerns today: Post-operative Bariatric Surgery Nutrition Management.  Joshua [name] returns to [facility] for a follow having gained about 15 pounds recently. Recently went to see Dr. Ezzard Standing due to having problems coughing at night. He had fluid removed (2cc) from his band last Thursday and his coughing has since improved. He plans to follow up with Dr. Ezzard Standing ina few weeks. He would like to get back down to 285 lbs. He works 2nd shift and gets two 20-minute breaks. Eats greek yogurt and chips for breakfast. Sometimes eats [employer] but finds he does not have enough time to eat it. He reports that he eats poorly on the weekends and when he is not at work. Additionally, Tim's wife is not supportive and keeps trigger foods in the house.  Surgery date: 07/09/12 Surgery type: LAGB Start weight at [facility]: 305.0 lbs (12/23/11) 330 lbs at heaviest weight per pt Goal weight: 285 lbs  Weight today: 301.5 lbs Weight change: 16.5 lbs gain Total weight lost: 28.5 lbs  TANITA  BODY COMP RESULTS  12/23/11 06/27/12 07/23/12 09/03/12 10/07/12 01/06/13 07/17/13 10/16/13 01/19/14 01/04/15   BMI (kg/m^2) 38.1 36.2 34.3 34.2 33.7 35.6 37.4 36.5 36.9 38.7   Fat Mass (lbs) -- 106.5 93.5 90.0 86.0 95.5 114.0 98.0 100.5 117.5   Fat Free Mass (lbs) -- 183.5 181.0 184.0 183.5 182.0 177.0 186.5 187.0 184   Total Body Water (lbs) -- 134.5 132.5 134.5 134.5 133.0 129.5 136.5 137.0 134.5   24-hr recall: (works 3p - 11p) B (9-10 AM): sometimes skips, Raisin bran crunch cereal Snk (1:30 PM): fried chicken with coleslaw, sweet potato pie from [employer] (14g) L (5:30 PM): yogurt and chips (13 g)  Snk (9 PM):  Lean cusine (28g) Snk (11:30 PM): Wendy's burger with fries and unsweet tea, sometimes a Frosty (14g)  Bedtime at 3am   Fluid intake: water with Mio, Vitamin Water Zero, unsweet tea, Protein shake, diet  ginger ale, 2 glasses Lactaid 1% milk  Estimated total protein intake: 70-80g  Medications: See medication list Supplementation: Taking   Using straws: every once in a while Drinking while eating: Yes; trying to take baby sips (hiccups and painful if does not) Hair loss: No Carbonated beverages: some Diet ginger ale N/V/D/C:  None Last Lap-Band fill:  2 cc removed on 12/31/14 (total of 2 cc)  Recent physical activity:  2 days/week @ gym - walking around gym, stationary bike 15 min, 50 sit ups, & ends with elliptical for 10 min.  Progress Towards Goal(s):  In progress.  Handouts provided: Bariatric snack list and Bariatric Fast Food guide  Nutritional Diagnosis:  [place]-3.3 Overweight/obesity related to past poor dietary habits and physical inactivity as evidenced by patient w/ recent LAGB surgery following dietary guidelines for continued weight loss.  Monitoring/Evaluation:  Dietary intake, exercise, lap band fills, and body weight. Follow up in 2 months.

## 2015-01-04 NOTE — Patient Instructions (Addendum)
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

## 2015-02-25 ENCOUNTER — Other Ambulatory Visit: Payer: Self-pay | Admitting: Interventional Cardiology

## 2015-03-08 ENCOUNTER — Encounter: Payer: BLUE CROSS/BLUE SHIELD | Attending: Surgery | Admitting: Dietician

## 2015-03-08 ENCOUNTER — Encounter: Payer: Self-pay | Admitting: Dietician

## 2015-03-08 DIAGNOSIS — Z6838 Body mass index (BMI) 38.0-38.9, adult: Secondary | ICD-10-CM | POA: Diagnosis not present

## 2015-03-08 DIAGNOSIS — Z48815 Encounter for surgical aftercare following surgery on the digestive system: Secondary | ICD-10-CM | POA: Diagnosis present

## 2015-03-08 DIAGNOSIS — Z9884 Bariatric surgery status: Secondary | ICD-10-CM | POA: Insufficient documentation

## 2015-03-08 DIAGNOSIS — Z713 Dietary counseling and surveillance: Secondary | ICD-10-CM | POA: Diagnosis not present

## 2015-03-08 NOTE — Progress Notes (Signed)
  Follow-up visit:  2.5 years Post-Operative LAGB Surgery  Medical Nutrition Therapy:  Appt start time:  1035  End time:  1050  Primary concerns today: Post-operative Bariatric Surgery Nutrition Management.  Joshua [name] returns to [facility] for a follow up having lost 3 pounds in the last 2 months. He reports that he tried to increase protein intake and it made him constipated. He has been trying not to eat after 7 pm, however he sometimes needs a snack after work (works 2nd shift). Has not been in for a band fill recently; does not feel like he needs one. Quest bars cause nausea and constipation. Likes P3 packs. Wife is still bringing trigger foods into the house. Recently started avoiding dessert.  Surgery date: 07/09/12 Surgery type: LAGB Start weight at [facility]: 305.0 lbs (12/23/11) 330 lbs at heaviest weight per pt Goal weight: 285 lbs Weight today: 298 lbs Weight change: 3.5 lbs  Total weight lost: 32 lbs  TANITA  BODY COMP RESULTS  12/23/11 06/27/12 07/23/12 09/03/12 10/07/12 01/06/13 07/17/13 10/16/13 01/19/14 01/04/15 03/08/15   BMI (kg/m^2) 38.1 36.2 34.3 34.2 33.7 35.6 37.4 36.5 36.9 38.7 38.3   Fat Mass (lbs) -- 106.5 93.5 90.0 86.0 95.5 114.0 98.0 100.5 117.5 117.5   Fat Free Mass (lbs) -- 183.5 181.0 184.0 183.5 182.0 177.0 186.5 187.0 184 180.5   Total Body Water (lbs) -- 134.5 132.5 134.5 134.5 133.0 129.5 136.5 137.0 134.5 132   24-hr recall: (works 3p - 11p) B (9-10 AM): sometimes skips, sausage, eggs, and grits OR wheat toast and sausage Snk (1:30 PM): rotisserie chicken salad from [employer] L (5:30 PM): pork skins or P3 pack  Snk (1AM): P3 pack   Bedtime at 3am   Fluid intake: water with Mio, Vitamin Water Zero, unsweet tea, Protein shake, diet ginger ale, 2 glasses Lactaid 1% milk  Estimated total protein intake: 70-80g  Medications: See medication list Supplementation: Taking   Using straws: every once in a while Drinking while eating: Yes; trying to take baby sips (hiccups and painful if  does not) Hair loss: No Carbonated beverages: some Diet ginger ale N/V/D/C:  None Last Lap-Band fill:  2 cc removed on 12/31/14 (total of 2 cc)  Recent physical activity:  2 days/week @ gym - walking around gym, stationary bike 15 min, 50 sit ups, & ends with elliptical for 10 min.  Progress Towards Goal(s):  In progress.  Handouts provided: Bariatric pre op diet  Nutritional Diagnosis:  [place]-3.3 Overweight/obesity related to past poor dietary habits and physical inactivity as evidenced by patient w/ recent LAGB surgery following dietary guidelines for continued weight loss.  Monitoring/Evaluation:  Dietary intake, exercise, lap band fills, and body weight. Follow up in 2 months.

## 2015-03-08 NOTE — Patient Instructions (Signed)
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

## 2015-04-30 ENCOUNTER — Other Ambulatory Visit: Payer: Self-pay | Admitting: Interventional Cardiology

## 2015-05-10 ENCOUNTER — Ambulatory Visit: Payer: BLUE CROSS/BLUE SHIELD | Admitting: Dietician

## 2015-06-21 ENCOUNTER — Ambulatory Visit: Payer: BLUE CROSS/BLUE SHIELD | Admitting: Dietician

## 2015-06-30 ENCOUNTER — Other Ambulatory Visit: Payer: Self-pay | Admitting: Interventional Cardiology

## 2015-06-30 DIAGNOSIS — I10 Essential (primary) hypertension: Secondary | ICD-10-CM

## 2015-06-30 MED ORDER — VALSARTAN-HYDROCHLOROTHIAZIDE 320-25 MG PO TABS: 1.0000 | ORAL_TABLET | Freq: Every day | ORAL | Status: DC

## 2015-07-01 ENCOUNTER — Other Ambulatory Visit: Payer: Self-pay | Admitting: Interventional Cardiology

## 2015-07-29 ENCOUNTER — Other Ambulatory Visit: Payer: Self-pay | Admitting: Interventional Cardiology

## 2015-07-29 ENCOUNTER — Encounter: Payer: BLUE CROSS/BLUE SHIELD | Attending: Surgery | Admitting: Dietician

## 2015-07-29 ENCOUNTER — Encounter: Payer: Self-pay | Admitting: Dietician

## 2015-07-29 DIAGNOSIS — Z029 Encounter for administrative examinations, unspecified: Secondary | ICD-10-CM | POA: Insufficient documentation

## 2015-07-29 NOTE — Patient Instructions (Addendum)
Goals:  -Work on avoiding desserts -Choose the roasted chicken bites from [employer] -Work on redeveloping a routine and structure -Think about pre-preparing meals at home ahead of time -Try having scrambled eggs for breakfast instead of cereal -Focus on lean protein and vegetables  -Use the pre op diet as a guide -Avoid skipping meals, especially on the weekends -Continue exercise routine!  -Try pre-preparing meals at home (make extra for leftovers for the week) -Choose a lean protein meal for when you get off work: Wendy's chili, hamburger patties, "unwich" lettuce wrap, tuna/chicken/egg salad  -Have a plan! -Avoid yogurt pretzels

## 2015-07-29 NOTE — Progress Notes (Signed)
  Follow-up visit:  3 years Post-Operative LAGB Surgery  Medical Nutrition Therapy:  Appt start time:  920  End time:  945  Primary concerns today: Post-operative Bariatric Surgery Nutrition Management.  Joshua [name] returns having gained 11 pounds. He has not had a fill recently, stating he does not feel like he needs one; "I still feel resistance when I eat." He reports fear of having fluid added to band because of the nightly coughing fits he had in the past. Joshua [name] states he has the most trouble with his diet after work and on the weekends. Still sometimes skips breakfast and is able to eat 12-inch sub with chips. Having more desserts. However, he is maintaining good exercise habits.   Surgery date: 07/09/12 Surgery type: LAGB Start weight at [facility]: 305.0 lbs (12/23/11) 330 lbs at heaviest weight per pt Goal weight: 285 lbs Weight today: 309.1 lbs Weight change: 11 lbs gain  Total weight lost: 20 lbs  TANITA  BODY COMP RESULTS  12/23/11 06/27/12 07/23/12 09/03/12 10/07/12 01/06/13 07/17/13 10/16/13 01/19/14 01/04/15 03/08/15   BMI (kg/m^2) 38.1 36.2 34.3 34.2 33.7 35.6 37.4 36.5 36.9 38.7 38.3   Fat Mass (lbs) -- 106.5 93.5 90.0 86.0 95.5 114.0 98.0 100.5 117.5 117.5   Fat Free Mass (lbs) -- 183.5 181.0 184.0 183.5 182.0 177.0 186.5 187.0 184 180.5   Total Body Water (lbs) -- 134.5 132.5 134.5 134.5 133.0 129.5 136.5 137.0 134.5 132   24-hr recall: (works 3p - 11p) B (9-10 AM): sometimes skips, sausage, eggs, and grits OR wheat toast or grits and sausage Snk (1:30 PM): 12-inch sub with chips L (5:30 PM): yogurt pretzels and P3 pack  Snk (11:30 PM): [employer] chicken sandwich     Bedtime at 3am   Fluid intake: water with [place], Vitamin Water Zero, unsweet tea, Protein shake, diet ginger ale, 2 glasses Lactaid 1% milk  Estimated total protein intake: 70-80g  Medications: See medication list Supplementation: Taking   Using straws: every once in a while Drinking while eating: Yes; trying to take baby sips  (hiccups and painful if does not) Hair loss: No Carbonated beverages: some Diet ginger ale N/V/D/C:  None Last Lap-Band fill:  2 cc removed on 12/31/14 (total of 2 cc)  Recent physical activity:  2 days/week @ gym - walking around gym, stationary bike 15 min, 50 sit ups, & ends with elliptical for 10 min.   Progress Towards Goal(s):  In progress.  Handouts provided: Bariatric pre op diet  Nutritional Diagnosis:  [place]-3.3 Overweight/obesity related to past poor dietary habits and physical inactivity as evidenced by patient w/ recent LAGB surgery following dietary guidelines for continued weight loss.  Monitoring/Evaluation:  Dietary intake, exercise, lap band fills, and body weight. Follow up in 2 months.

## 2015-08-03 NOTE — Progress Notes (Signed)
Cardiology Office Note:    Date:  08/04/2015   ID:  Joshua [name], DOB 29-Feb-1966, MRN [id number]  PCP:  Joshua Blamer, MD  Cardiologist:  Dr. Verdis [name]   Electrophysiologist:  n/a  Chief Complaint  Patient presents with  . Congestive Heart Failure    Follow up    History of Present Illness:     Joshua [name] is a 50 y.o. male with a hx of bicuspid aortic valve disease with mild stenosis, combined systolic and diastolic CHF NICM with prior EF 40-45% (improved to normal by echo in 05/2014), 2nd degree AVB resolved off of beta blockers, HTN, OSA. Last seen by Dr. Verdis [name] 04/2014. FU echo demonstrated normal LVF and mild AS.  I saw him last 06/19/14.     Patient returns for follow-up. He is doing well. The patient denies any chest pain, significant dyspnea, syncope, orthopnea, PND, edema.    Past Medical History  Diagnosis Date  . Hypertension   . Aortic stenosis     a. Echo 12/15 - EF 55-60%, normal WM, Gr 1 DD, mild AS, mild AI, mean AV 18 mmHg  . Kidney stone     nephrolithiasis  . Hypogonadism male   . Nonischemic cardiomyopathy (HCC)   . Sleep apnea   . Hyperlipidemia   . Chronic combined systolic and diastolic CHF (congestive heart failure) (HCC)   . Second degree AV block     Resolved off of beta blockers  . H/O hiatal hernia   . Arthritis     LEFT KNEE  . History of cardiac catheterization     LHC 12/12:Patent but Ectatic LAD and RCA (? 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    Past Surgical History  Procedure Laterality Date  . Cardiac catheterization    . Cosmetic surgery      testicle  . Breath tek h pylori  12/05/2011    Procedure: BREATH TEK H PYLORI;  Surgeon: Joshua Cocking, MD;  Location: [facility];  Service: General;  Laterality: N/A;  . Mesh applied to lap port  07/09/2012    Procedure: MESH APPLIED TO LAP PORT;  Surgeon: Joshua Cocking, MD;  Location: [facility];  Service: General;;  . Hiatal hernia repair  07/09/2012   Procedure: LAPAROSCOPIC REPAIR OF HIATAL HERNIA;  Surgeon: Joshua Cocking, MD;  Location: [facility];  Service: General;;  . Laparoscopic gastric banding  07/09/12  . Left heart catheterization with coronary angiogram N/A 06/01/2011    Procedure: LEFT HEART CATHETERIZATION WITH CORONARY ANGIOGRAM;  Surgeon: Joshua Noe, MD;  Location: [facility] CATH LAB;  Service: Cardiovascular;  Laterality: N/A;    Current Medications: Outpatient Prescriptions Prior to Visit  Medication Sig Dispense Refill  . aspirin 81 MG tablet Take 81 mg by mouth daily.    . magnesium oxide (MAG-OX) 400 (241.3 MG) MG tablet Take 400 mg by mouth daily as needed (FOR CRAMPS).     . naphazoline-pheniramine (NAPHCON-A) 0.025-0.3 % ophthalmic solution Place 1 drop into both eyes as needed. RED EYES    . testosterone cypionate (DEPOTESTOTERONE CYPIONATE) 200 MG/ML injection Inject into the muscle every 14 (fourteen) days.     . vardenafil (LEVITRA) 20 MG tablet Take 20 mg by mouth daily as needed for erectile dysfunction. ED    . naproxen sodium (ANAPROX) 220 MG tablet Take 220 mg by mouth 2 (two) times daily with a meal. Reported on 08/04/2015    . spironolactone (ALDACTONE) 25 MG tablet  TAKE 1 TABLET BY MOUTH EVERY DAY 15 tablet 0  . valsartan-hydrochlorothiazide (DIOVAN-HCT) 320-25 MG tablet TAKE 1 TABLET BY MOUTH EVERY DAY 14 tablet 0   No facility-administered medications prior to visit.     Allergies:   Bystolic and Atenolol   Social History   Social History  . Marital Status: Married    Spouse Name: N/A  . Number of Children: N/A  . Years of Education: N/A   Social History Main Topics  . Smoking status: Never Smoker   . Smokeless tobacco: Never Used  . Alcohol Use: No  . Drug Use: No  . Sexual Activity: Yes   Other Topics Concern  . None   Social History Narrative     Family History:  The patient's family history includes Cancer in his sister; Heart disease in his father; Hypertension in his brother; Stroke  in his mother. There is no history of Heart attack.   ROS:   Please see the history of present illness.    ROS All other systems reviewed and are negative.   Physical Exam:    VS:  BP 112/80 mmHg  Pulse 72  Ht  (1.88 m)  Wt 312 lb (141.522 kg)  BMI 40.04 kg/m2   GEN: Well nourished, well developed, in no acute distress HEENT: normal Neck: no JVD, no masses Cardiac: Normal S1/S2, RRR; harsh crescendo decrescendo 2/6 systolic murmur RUSB,   no edema  Respiratory:  clear to auscultation bilaterally; no wheezing, rhonchi or rales GI: soft, nontender  MS: no deformity or atrophy Skin: warm and dry  Neuro:   no focal deficits  Psych: Alert and oriented x 3, normal affect  Wt Readings from Last 3 Encounters:  08/04/15 312 lb (141.522 kg)  07/29/15 309 lb 1.6 oz (140.207 kg)  03/08/15 298 lb (135.172 kg)      Studies/Labs Reviewed:     EKG:  EKG is  ordered today.  The ekg ordered today demonstrates NSR, HR 72, normal axis, first-degree AV block, PR 224 ms, QTC 398 ms, no change from prior tracing  Recent Labs: 08/20/2014: BUN 15; Creatinine, Ser 1.35; Potassium 4.2; Sodium 137   Recent Lipid Panel    Component Value Date/Time   CHOL 145 11/27/2011 0933   TRIG 66 11/27/2011 0933   HDL 40 11/27/2011 0933   CHOLHDL 3.6 11/27/2011 0933   LDLCALC 92 11/27/2011 0933    Additional studies/ records that were reviewed today include:   LHC (12/12):  Patent but Ectatic LAD and RCA (? 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   Echo (12/15):  EF 55-60%, normal WM, Gr 1 DD, mild AS, mild AI, mean AV 18 mmHg   ASSESSMENT:     1. Essential hypertension   2. Chronic combined systolic and diastolic CHF (congestive heart failure) (HCC)   3. Aortic stenosis with bicuspid valve   4. Morbid obesity, unspecified obesity type (HCC)     PLAN:     In order of problems listed above:  1.Hypertension: Controlled.  Continue current Rx.  Check BMET today.    2.  Chronic Combined Systolic and Diastolic CHF: EF improved to normal by last echo. Continue ARB. He is no longer on beta blocker 2/2 bradycardia. Volume stable.  3. Aortic Stenosis: Mild by last echo.  His murmur is fairly loud. He had bicuspid aortic valve.  Will get FU echo to recheck AV gradients.   4. Obesity - He is s/p Lap Band.  He is  trying to lose weight.     Medication Adjustments/Labs and Tests Ordered: Current medicines are reviewed at length with the patient today.  Concerns regarding medicines are outlined above.  Medication changes, Labs and Tests ordered today are outlined in the Patient Instructions noted below. Patient Instructions  Medication Instructions:  No changes.  See your medication list.  Labwork: Today - BMET  Testing/Procedures: Schedule an Echocardiogram (Dx: Bicuspid Aortic Valve; Aortic Stenosis) Per Tereso Newcomer, PA-C, Please have billing speak with the pt re: bill.  Pt states that he had a high bill on the last one he had done.   Follow-Up: Dr. Verdis [name] or Tereso Newcomer, PA-C in 1 year.  Any Other Special Instructions Will Be Listed Below (If Applicable). On your last echocardiogram, your Ejection Fraction was 55-60%.  This is the normal range.  If you need a refill on your cardiac medications before your next appointment, please call your pharmacy.    Signed, Tereso Newcomer, PA-C  08/04/2015 11:53 AM    [facility] [address] [place], [place], [place]  [address] Phone: [phone]; Fax: [phone]

## 2015-08-04 ENCOUNTER — Encounter: Payer: Self-pay | Admitting: Physician Assistant

## 2015-08-04 ENCOUNTER — Ambulatory Visit (INDEPENDENT_AMBULATORY_CARE_PROVIDER_SITE_OTHER): Payer: BLUE CROSS/BLUE SHIELD | Admitting: Physician Assistant

## 2015-08-04 VITALS — BP 112/80 | HR 72 | Ht 74.0 in | Wt 312.0 lb

## 2015-08-04 DIAGNOSIS — I429 Cardiomyopathy, unspecified: Secondary | ICD-10-CM

## 2015-08-04 DIAGNOSIS — I359 Nonrheumatic aortic valve disorder, unspecified: Secondary | ICD-10-CM | POA: Diagnosis not present

## 2015-08-04 DIAGNOSIS — I5042 Chronic combined systolic (congestive) and diastolic (congestive) heart failure: Secondary | ICD-10-CM | POA: Diagnosis not present

## 2015-08-04 DIAGNOSIS — Q230 Congenital stenosis of aortic valve: Secondary | ICD-10-CM

## 2015-08-04 DIAGNOSIS — Q231 Congenital insufficiency of aortic valve: Secondary | ICD-10-CM

## 2015-08-04 DIAGNOSIS — I10 Essential (primary) hypertension: Secondary | ICD-10-CM | POA: Diagnosis not present

## 2015-08-04 MED ORDER — SPIRONOLACTONE 25 MG PO TABS: 25.0000 mg | ORAL_TABLET | Freq: Every day | ORAL | Status: DC

## 2015-08-04 MED ORDER — VALSARTAN-HYDROCHLOROTHIAZIDE 320-25 MG PO TABS: 1.0000 | ORAL_TABLET | Freq: Every day | ORAL | Status: DC

## 2015-08-04 NOTE — Patient Instructions (Addendum)
Medication Instructions:  No changes.  See your medication list.  Labwork: Today - BMET  Testing/Procedures: Schedule an Echocardiogram (Dx: Bicuspid Aortic Valve; Aortic Stenosis) Per Tereso Newcomer, PA-C, Please have billing speak with the pt re: bill.  Pt states that he had a high bill on the last one he had done.   Follow-Up: Dr. Verdis Prime or Tereso Newcomer, PA-C in 1 year.  Any Other Special Instructions Will Be Listed Below (If Applicable). On your last echocardiogram, your Ejection Fraction was 55-60%.  This is the normal range.  If you need a refill on your cardiac medications before your next appointment, please call your pharmacy.

## 2015-08-05 ENCOUNTER — Telehealth: Payer: Self-pay | Admitting: *Deleted

## 2015-08-05 LAB — BASIC METABOLIC PANEL
BUN: 28 mg/dL — AB (ref 7–25)
CO2: 22 mmol/L (ref 20–31)
CREATININE: 1.41 mg/dL — AB (ref 0.60–1.35)
Calcium: 9.5 mg/dL (ref 8.6–10.3)
Chloride: 102 mmol/L (ref 98–110)
GLUCOSE: 88 mg/dL (ref 65–99)
Potassium: 4.5 mmol/L (ref 3.5–5.3)
Sodium: 139 mmol/L (ref 135–146)

## 2015-08-05 NOTE — Telephone Encounter (Signed)
Lmom lab work ok, kidney function stable, K+ normal. Any questions can call back to [phone].

## 2015-08-19 ENCOUNTER — Other Ambulatory Visit (HOSPITAL_COMMUNITY): Payer: BLUE CROSS/BLUE SHIELD

## 2015-08-20 ENCOUNTER — Other Ambulatory Visit: Payer: Self-pay | Admitting: Interventional Cardiology

## 2015-10-13 ENCOUNTER — Other Ambulatory Visit: Payer: Self-pay | Admitting: Urology

## 2015-11-04 ENCOUNTER — Encounter (HOSPITAL_COMMUNITY)
Admission: RE | Admit: 2015-11-04 | Discharge: 2015-11-04 | Disposition: A | Discharge status: Home / Self Care | Payer: BLUE CROSS/BLUE SHIELD | Source: Ambulatory Visit | Attending: Urology | Admitting: Urology

## 2015-11-04 ENCOUNTER — Encounter (HOSPITAL_COMMUNITY): Payer: Self-pay

## 2015-11-04 DIAGNOSIS — D751 Secondary polycythemia: Secondary | ICD-10-CM | POA: Insufficient documentation

## 2015-11-04 DIAGNOSIS — E291 Testicular hypofunction: Secondary | ICD-10-CM | POA: Diagnosis not present

## 2015-11-04 NOTE — Discharge Instructions (Signed)
Therapeutic Phlebotomy, Care After  Refer to this sheet in the next few weeks. These instructions provide you with information about caring for yourself after your procedure. Your health care provider may also give you more specific instructions. Your treatment has been planned according to current medical practices, but problems sometimes occur. Call your health care provider if you have any problems or questions after your procedure.  WHAT TO EXPECT AFTER THE PROCEDURE  After your procedure, it is common to have:   Light-headedness or dizziness. You may feel faint.   Nausea.   Tiredness.  HOME CARE INSTRUCTIONS  Activities   Return to your normal activities as directed by your health care provider. Most people can go back to their normal activities right away.   Avoid strenuous physical activity and heavy lifting or pulling for about 5 hours after the procedure. Do not lift anything that is heavier than 10 lb (4.5 kg).   Athletes should avoid strenuous exercise for at least 12 hours.   Change positions slowly for the remainder of the day. This will help to prevent light-headedness or fainting.   If you feel light-headed, lie down until the feeling goes away.  Eating and Drinking   Be sure to eat well-balanced meals for the next 24 hours.   Drink enough fluid to keep your urine clear or pale yellow.   Avoid drinking alcohol on the day that you had the procedure.  Care of the Needle Insertion Site   Keep your bandage dry. You can remove the bandage after about 5 hours or as directed by your health care provider.   If you have bleeding from the needle insertion site, elevate your arm and press firmly on the site until the bleeding stops.   If you have bruising at the site, apply ice to the area:   Put ice in a plastic bag.   Place a towel between your skin and the bag.   Leave the ice on for 20 minutes, 2-3 times a day for the first 24 hours.   If the swelling does not go away after 24 hours, apply  a warm, moist washcloth to the area for 20 minutes, 2-3 times a day.  General Instructions   Avoid smoking for at least 30 minutes after the procedure.   Keep all follow-up visits as directed by your health care provider. It is important to continue with further therapeutic phlebotomy treatments as directed.  SEEK MEDICAL CARE IF:   You have redness, swelling, or pain at the needle insertion site.   You have fluid, blood, or pus coming from the needle insertion site.   You feel light-headed, dizzy, or nauseated, and the feeling does not go away.   You notice new bruising at the needle insertion site.   You feel weaker than normal.   You have a fever or chills.  SEEK IMMEDIATE MEDICAL CARE IF:   You have severe nausea or vomiting.   You have chest pain.   You have trouble breathing.    This information is not intended to replace advice given to you by your health care provider. Make sure you discuss any questions you have with your health care provider.    Document Released: 10/31/2010 Document Revised: 10/13/2014 Document Reviewed: 05/25/2014  Elsevier Interactive Patient Education 2016 [employer]

## 2015-11-04 NOTE — Progress Notes (Signed)
Joshua [name] presents today for phlebotomy per MD orders. Phlebotomy procedure started at 0730, ended 0830. 500 cc removed. Patient tolerated procedure well. IV removed intact. Pt ambulatory for discharge. Next appointment confirmed.

## 2015-11-18 ENCOUNTER — Encounter (HOSPITAL_COMMUNITY)
Admission: RE | Admit: 2015-11-18 | Discharge: 2015-11-18 | Disposition: A | Discharge status: Home / Self Care | Payer: BLUE CROSS/BLUE SHIELD | Source: Ambulatory Visit | Attending: Urology | Admitting: Urology

## 2015-11-18 ENCOUNTER — Encounter (HOSPITAL_COMMUNITY): Payer: Self-pay

## 2015-11-18 DIAGNOSIS — E291 Testicular hypofunction: Secondary | ICD-10-CM | POA: Insufficient documentation

## 2015-11-18 DIAGNOSIS — D751 Secondary polycythemia: Secondary | ICD-10-CM | POA: Insufficient documentation

## 2015-11-18 NOTE — Progress Notes (Signed)
Pt states it was his understanding the therapeutic phlebotomy was for 2 times (May 25 and today) and then he is having blood drawn on June 15 to follow up on his blood counts.

## 2015-11-18 NOTE — Procedures (Signed)
#  20 IV started to rt wrist without difficulty.  Therapeutic Phlebotomy started at 0845 and finished at 0925.  Post VS stable, wnl.  Pt tolerated well.  Pt had gingerale and graham crackers during phlebotomy.  Pt states he had no dizziness or lightheadedness upon standing.  Pt was d/c ambulatory to lobby.

## 2015-11-28 ENCOUNTER — Other Ambulatory Visit: Payer: Self-pay | Admitting: Physician Assistant

## 2016-01-27 ENCOUNTER — Ambulatory Visit: Payer: BLUE CROSS/BLUE SHIELD | Admitting: Dietician

## 2016-11-24 ENCOUNTER — Other Ambulatory Visit: Payer: Self-pay | Admitting: Physician Assistant

## 2016-12-01 ENCOUNTER — Other Ambulatory Visit: Payer: Self-pay | Admitting: Physician Assistant

## 2016-12-01 MED ORDER — SPIRONOLACTONE 25 MG PO TABS: 25.0000 mg | ORAL_TABLET | Freq: Every day | ORAL | 1 refills | Status: DC

## 2016-12-01 MED ORDER — VALSARTAN-HYDROCHLOROTHIAZIDE 320-25 MG PO TABS: 1.0000 | ORAL_TABLET | Freq: Every day | ORAL | 1 refills | Status: DC

## 2016-12-06 ENCOUNTER — Telehealth: Payer: Self-pay | Admitting: Physician Assistant

## 2016-12-06 MED ORDER — VALSARTAN-HYDROCHLOROTHIAZIDE 320-25 MG PO TABS: 1.0000 | ORAL_TABLET | Freq: Every day | ORAL | 1 refills | Status: DC

## 2016-12-06 MED ORDER — SPIRONOLACTONE 25 MG PO TABS: 25.0000 mg | ORAL_TABLET | Freq: Every day | ORAL | 1 refills | Status: DC

## 2016-12-06 NOTE — Addendum Note (Signed)
Addended by: Etheleen Mayhew C on: 12/06/2016 11:30 AM   Modules accepted: Orders

## 2016-12-06 NOTE — Telephone Encounter (Signed)
NEW MESSAGE   *STAT* If patient is at the pharmacy, call can be transferred to refill team.   1. Which medications need to be refilled? (please list name of each medication and dose if known)  VALSARTAN SPIRONOLACTONE  2. Which pharmacy/location (including street and city if local pharmacy) is medication to be sent to? [facility]  3. Do they need a 30 day or 90 day supply?  90DAY   PT SAID HE HAS WENT TO HIS PHARMACY AND THEY HAVE BEEN SENDING FAXES TO [place] TO HAVE THESE FILLED OUT BUT NOONE HAS RESPONDED. ALMOST OUT AND NEEDS REFILLED ASAP

## 2016-12-06 NOTE — Telephone Encounter (Signed)
Will send both prescriptions in for 30 day supply. Patient needs to keep July appointment for further refills. Has not been seen since 08/04/15

## 2016-12-08 ENCOUNTER — Other Ambulatory Visit: Payer: Self-pay | Admitting: *Deleted

## 2016-12-08 MED ORDER — VALSARTAN-HYDROCHLOROTHIAZIDE 320-25 MG PO TABS: 1.0000 | ORAL_TABLET | Freq: Every day | ORAL | 0 refills | Status: DC

## 2016-12-08 MED ORDER — SPIRONOLACTONE 25 MG PO TABS: 25.0000 mg | ORAL_TABLET | Freq: Every day | ORAL | 0 refills | Status: DC

## 2016-12-08 NOTE — Telephone Encounter (Signed)
Pt calling stating that his insurance will not cover his medications because it is not a 90 day supply and I also called his pharmacy and they stated that the pt's insurance will not cover his medication and that the pt would have to pay out of pocket for a 30 day supply. Please advise

## 2016-12-08 NOTE — Telephone Encounter (Signed)
Ok to fill for 90 days x 1.  If appt is not kept, will need to get from PCP.

## 2017-01-01 ENCOUNTER — Ambulatory Visit (INDEPENDENT_AMBULATORY_CARE_PROVIDER_SITE_OTHER): Payer: 59 | Admitting: Physician Assistant

## 2017-01-01 ENCOUNTER — Telehealth: Payer: Self-pay | Admitting: *Deleted

## 2017-01-01 ENCOUNTER — Encounter: Payer: Self-pay | Admitting: Physician Assistant

## 2017-01-01 VITALS — BP 118/88 | HR 79 | Ht 75.0 in | Wt 329.4 lb

## 2017-01-01 DIAGNOSIS — Q231 Congenital insufficiency of aortic valve: Secondary | ICD-10-CM

## 2017-01-01 DIAGNOSIS — I5042 Chronic combined systolic (congestive) and diastolic (congestive) heart failure: Secondary | ICD-10-CM | POA: Diagnosis not present

## 2017-01-01 DIAGNOSIS — I10 Essential (primary) hypertension: Secondary | ICD-10-CM | POA: Diagnosis not present

## 2017-01-01 DIAGNOSIS — Q230 Congenital stenosis of aortic valve: Secondary | ICD-10-CM | POA: Diagnosis not present

## 2017-01-01 LAB — BASIC METABOLIC PANEL
BUN/Creatinine Ratio: 13 (ref 9–20)
BUN: 17 mg/dL (ref 6–24)
CHLORIDE: 100 mmol/L (ref 96–106)
CO2: 31 mmol/L — ABNORMAL HIGH (ref 20–29)
Calcium: 10.2 mg/dL (ref 8.7–10.2)
Creatinine, Ser: 1.34 mg/dL — ABNORMAL HIGH (ref 0.76–1.27)
GFR calc non Af Amer: 62 mL/min/{1.73_m2} (ref >59)
GFR, EST AFRICAN AMERICAN: 72 mL/min/{1.73_m2} (ref >59)
GLUCOSE: 124 mg/dL — AB (ref 65–99)
POTASSIUM: 4.8 mmol/L (ref 3.5–5.2)
SODIUM: 140 mmol/L (ref 134–144)

## 2017-01-01 MED ORDER — SPIRONOLACTONE 25 MG PO TABS
25.0000 mg | ORAL_TABLET | Freq: Every day | ORAL | 3 refills | Status: DC
Start: 2017-01-01 — End: 2017-12-23

## 2017-01-01 MED ORDER — VALSARTAN-HYDROCHLOROTHIAZIDE 320-25 MG PO TABS: 1.0000 | ORAL_TABLET | Freq: Every day | ORAL | 3 refills | Status: DC

## 2017-01-01 NOTE — Patient Instructions (Signed)
Medication Instructions:  Your physician recommends that you continue on your current medications as directed. Please refer to the Current Medication list given to you today.   Labwork: TODAY BMET  Testing/Procedures: 1. Your physician has requested that you have an echocardiogram. Echocardiography is a painless test that uses sound waves to create images of your heart. It provides your doctor with information about the size and shape of your heart and how well your heart's chambers and valves are working. This procedure takes approximately one hour. There are no restrictions for this procedure.    Follow-Up: Your physician wants you to follow-up in: 1. YEAR WITH [facility]. You will receive a reminder letter in the mail two months in advance. If you don't receive a letter, please call our office to schedule the follow-up appointment.   Any Other Special Instructions Will Be Listed Below (If Applicable).     If you need a refill on your cardiac medications before your next appointment, please call your pharmacy.

## 2017-01-01 NOTE — Telephone Encounter (Signed)
-----   Message from Beatrice Lecher, [place] sent at 01/01/2017  5:17 PM EDT ----- Please call the patient. Kidney function is stable. All other parameters are within acceptable limits and no further intervention or testing required. Continue with current treatment plan. Tereso Newcomer, PA-C   01/01/2017 5:16 PM

## 2017-01-01 NOTE — Telephone Encounter (Signed)
Lmtcb to go over lab results 

## 2017-01-01 NOTE — Progress Notes (Signed)
Cardiology Office Note:    Date:  01/01/2017   ID:  Joshua [name], DOB [birth year], MRN [id number]  PCP:  Joshua Blamer, MD  Cardiologist:  Dr. Verdis [name]    Referring MD: Joshua Blamer, MD   Chief Complaint  Patient presents with  . Follow-up    CHF, aortic stenosis, HTN    History of Present Illness:    Joshua [name] is a 51 y.o. 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 Last seen in 2/17.  Mr. Joshua [name] returns for follow-up. He is here alone.  He denies chest pain, shortness of breath, syncope, orthopnea, PND or significant pedal edema. He works out at [employer] several days a week without difficulty.  Prior CV studies:   The following studies were reviewed today:  LHC (12/12):   Patent but Ectatic LAD and RCA (? 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     Echo (12/15):   EF 55-60%, normal WM, Gr 1 DD, mild AS, mild AI, mean AV 18 mmHg   Past Medical History:  Diagnosis Date  . Aortic stenosis    a. Echo 12/15 - EF 55-60%, normal WM, Gr 1 DD, mild AS, mild AI, mean AV 18 mmHg  . Arthritis    LEFT KNEE  . Chronic combined systolic and diastolic CHF (congestive heart failure) (HCC)   . H/O hiatal hernia   . History of cardiac catheterization    LHC 12/12:Patent but Ectatic LAD and RCA (? 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  . Hyperlipidemia   . Hypertension   . Hypogonadism male   . Kidney stone    nephrolithiasis  . Nonischemic cardiomyopathy (HCC)   . Second degree AV block    Resolved off of beta blockers  . Sleep apnea     Past Surgical History:  Procedure Laterality Date  . BREATH TEK H PYLORI  12/05/2011   Procedure: BREATH TEK H PYLORI;  Surgeon: Kandis Cocking, MD;  Location: [facility];  Service: General;  Laterality: N/A;  . CARDIAC  CATHETERIZATION    . COSMETIC SURGERY     testicle  . HIATAL HERNIA REPAIR  07/09/2012   Procedure: LAPAROSCOPIC REPAIR OF HIATAL HERNIA;  Surgeon: Kandis Cocking, MD;  Location: [facility];  Service: General;;  . LAPAROSCOPIC GASTRIC BANDING  07/09/12  . LEFT HEART CATHETERIZATION WITH CORONARY ANGIOGRAM N/A 06/01/2011   Procedure: LEFT HEART CATHETERIZATION WITH CORONARY ANGIOGRAM;  Surgeon: Lesleigh Noe, MD;  Location: [facility] CATH LAB;  Service: Cardiovascular;  Laterality: N/A;  . MESH APPLIED TO LAP PORT  07/09/2012   Procedure: MESH APPLIED TO LAP PORT;  Surgeon: Kandis Cocking, MD;  Location: [facility];  Service: General;;    Current Medications: Current Meds  Medication Sig  . anastrozole (ARIMIDEX) 1 MG tablet TAKE 1 TABLET 1MG  BY MOUTH ONCE DAILY  . aspirin 81 MG tablet Take 81 mg by mouth daily.  Marland Kitchen glucosamine-chondroitin 500-400 MG tablet Take 1 tablet by mouth daily.  . magnesium oxide (MAG-OX) 400 (241.3 MG) MG tablet Take 400 mg by mouth daily as needed (FOR CRAMPS).   . naphazoline-pheniramine (NAPHCON-A) 0.025-0.3 % ophthalmic solution Place 1 drop into both eyes as needed. RED EYES  . spironolactone (ALDACTONE) 25 MG tablet Take 1 tablet (25 mg  total) by mouth daily.  Marland Kitchen testosterone cypionate (DEPOTESTOTERONE CYPIONATE) 200 MG/ML injection Inject into the muscle every 14 (fourteen) days.   . valsartan-hydrochlorothiazide (DIOVAN-HCT) 320-25 MG tablet Take 1 tablet by mouth daily.  . vardenafil (LEVITRA) 20 MG tablet Take 20 mg by mouth daily as needed for erectile dysfunction. ED     Allergies:   Bystolic [nebivolol hcl] and Atenolol   Social History   Social History  . Marital status: Married    Spouse name: N/A  . Number of children: N/A  . Years of education: N/A   Social History Main Topics  . Smoking status: Never Smoker  . Smokeless tobacco: Never Used  . Alcohol use No  . Drug use: No  . Sexual activity: Yes   Other Topics Concern  . None   Social History  Narrative  . None     Family Hx: The patient's family history includes Cancer in his sister; Heart disease in his father; Hypertension in his brother; Stroke in his mother. There is no history of Heart attack.  ROS:   Please see the history of present illness.    ROS All other systems reviewed and are negative.   EKGs/Labs/Other Test Reviewed:    EKG:  EKG is  ordered today.  The ekg ordered today demonstrates NSR, HR 79, normal axis, QTC 417 ms, no change from prior tracing  Recent Labs: No results found for requested labs within last 8760 hours.   Recent Lipid Panel Lab Results  Component Value Date/Time   CHOL 145 11/27/2011 09:33 AM   TRIG 66 11/27/2011 09:33 AM   HDL 40 11/27/2011 09:33 AM   CHOLHDL 3.6 11/27/2011 09:33 AM   LDLCALC 92 11/27/2011 09:33 AM    Physical Exam:    VS:  BP 118/88   Pulse 79   Ht 6\' 3"  (1.905 m)   Wt (!) 329 lb 6.4 oz (149.4 kg)   BMI 41.17 kg/m     Wt Readings from Last 3 Encounters:  01/01/17 (!) 329 lb 6.4 oz (149.4 kg)  11/18/15 (!) 303 lb 6.4 oz (137.6 kg)  08/04/15 (!) 312 lb (141.5 kg)     Physical Exam  Constitutional: He is oriented to person, place, and time. He appears well-developed and well-nourished. No distress.  HENT:  Head: Normocephalic and atraumatic.  Eyes: No scleral icterus.  Neck: No JVD present.  Cardiovascular: Normal rate.   Murmur heard.  Harsh crescendo-decrescendo systolic murmur is present with a grade of 2/6  at the upper right sternal border Pulmonary/Chest: He has no wheezes. He has no rales.  Abdominal: He exhibits no mass. There is no tenderness.  Musculoskeletal: He exhibits no edema.  Neurological: He is alert and oriented to person, place, and time.  Skin: Skin is warm and dry.  Psychiatric: He has a normal mood and affect.    ASSESSMENT:    1. Chronic combined systolic and diastolic CHF (congestive heart failure) (HCC)   2. Aortic stenosis with bicuspid valve   3. Essential  hypertension   4. Morbid obesity (HCC) Chronic   PLAN:    In order of problems listed above:  1. Chronic combined systolic and diastolic CHF (congestive heart failure) (HCC) -  Ejection fraction was previously 40-45. Last echocardiogram in 2015 demonstrated normal LV function. He is NYHA 1. He is not on beta blocker secondary to bradycardia. Continue ARB, aldosterone antagonist. Arrange follow-up echocardiogram.  2. Aortic stenosis with bicuspid valve -  Bicuspid aortic valve.  Mean gradient by echocardiogram in 2015 was 18. He has not had a follow-up echocardiogram since that time. Arrange follow-up echo.  3. Essential hypertension -  Blood pressure controlled. I have asked him to continue to monitor his dietary sodium as his diastolic reading is somewhat elevated. Continue current therapy.  4. Morbid obesity (HCC), Chronic Status post lap band. He continues to work on diet and exercise for weight loss.   Dispo:  Return in about 1 year (around 01/01/2018) for Routine Follow UpWith Dr. Katrinka Blazing, or Tereso Newcomer, PA-C.   Medication Adjustments/Labs and Tests Ordered: Current medicines are reviewed at length with the patient today.  Concerns regarding medicines are outlined above.  Tests Ordered: Orders Placed This Encounter  Procedures  . Basic metabolic panel  . EKG 12-Lead  . ECHOCARDIOGRAM COMPLETE   Medication Changes: Meds ordered this encounter  Medications  . spironolactone (ALDACTONE) 25 MG tablet    Sig: Take 1 tablet (25 mg total) by mouth daily.    Dispense:  90 tablet    Refill:  3    Order Specific Question:   Supervising Provider    Answer:   [facility] [phone]  . valsartan-hydrochlorothiazide (DIOVAN-HCT) 320-25 MG tablet    Sig: Take 1 tablet by mouth daily.    Dispense:  90 tablet    Refill:  3    Order Specific Question:   Supervising Provider    Answer:   [facility] [[id number]]    Signed, Tereso Newcomer, PA-C  01/01/2017 9:56 AM    [facility] [address] [place], [place], [place]  [address] Phone: [phone]; Fax: [phone]

## 2017-01-02 NOTE — Telephone Encounter (Signed)
Pt has been notified of lab results by phone. Pt thanked me for my call today.

## 2017-01-24 ENCOUNTER — Telehealth (HOSPITAL_COMMUNITY): Payer: Self-pay | Admitting: Physician Assistant

## 2017-01-24 NOTE — Telephone Encounter (Signed)
User: Trina Ao A Date/time: 01/24/2017 12:13 PM  Comment: Called pt and lmsg for him to CB to r/s echo.   Context: [place] Schedule Orders/Appt Requests Outcome: Left Message  Phone number: [phone] Phone Type: Mobile  Comm. type: Telephone Call type: Outgoing  Contact: Joshua [name] Relation to patient: Self  Letter:      User: Trina Ao A Date/time: 01/18/2017 1:15 PM  Comment: Called pt and lmsg for him to CB to r/s echo.   Context: [place] Schedule Orders/Appt Requests Outcome: Left Message  Phone number: [phone] Phone Type: Mobile  Comm. type: Telephone Call type: Outgoing  Contact: Joshua [name] Relation to patient: Self  Letter:      User: Trina Ao A Date/time: 01/17/2017 9:57 AM  Comment: Called pt and lmsg for him to CB to r/s echo.  Context: [place] Schedule Orders/Appt Requests Outcome: Left Message  Phone number: [phone] Phone Type: Home Phone  Comm. type: Telephone Call type: Outgoing  Contact: Joshua [name], Joshua [name] Relation to patient: Self  Letter:       Patient cancelled 8/25 appt on 01/10/17.

## 2017-02-01 ENCOUNTER — Other Ambulatory Visit (HOSPITAL_COMMUNITY): Payer: 59

## 2017-02-05 ENCOUNTER — Encounter (HOSPITAL_COMMUNITY): Payer: Self-pay

## 2017-04-16 ENCOUNTER — Other Ambulatory Visit: Payer: Self-pay | Admitting: Family Medicine

## 2017-04-16 ENCOUNTER — Ambulatory Visit
Admission: RE | Admit: 2017-04-16 | Discharge: 2017-04-16 | Disposition: A | Discharge status: Home / Self Care | Payer: 59 | Source: Ambulatory Visit | Attending: Family Medicine | Admitting: Family Medicine

## 2017-04-16 DIAGNOSIS — M25571 Pain in right ankle and joints of right foot: Secondary | ICD-10-CM

## 2017-05-10 ENCOUNTER — Ambulatory Visit: Payer: Self-pay

## 2017-05-10 ENCOUNTER — Other Ambulatory Visit: Payer: Self-pay | Admitting: Occupational Medicine

## 2017-05-10 DIAGNOSIS — Z0000 Encounter for general adult medical examination without abnormal findings: Secondary | ICD-10-CM

## 2017-07-19 ENCOUNTER — Ambulatory Visit (INDEPENDENT_AMBULATORY_CARE_PROVIDER_SITE_OTHER): Payer: 59

## 2017-07-19 ENCOUNTER — Encounter: Payer: Self-pay | Admitting: Podiatry

## 2017-07-19 ENCOUNTER — Ambulatory Visit: Payer: 59 | Admitting: Podiatry

## 2017-07-19 DIAGNOSIS — M779 Enthesopathy, unspecified: Secondary | ICD-10-CM

## 2017-07-19 DIAGNOSIS — G8929 Other chronic pain: Secondary | ICD-10-CM

## 2017-07-19 DIAGNOSIS — M25571 Pain in right ankle and joints of right foot: Secondary | ICD-10-CM | POA: Diagnosis not present

## 2017-07-24 NOTE — Progress Notes (Signed)
Subjective:   Patient ID: Joshua [name], male   DOB: 52 y.o.   MRN: [id number]   HPI Joshua [name] presents the office today for concerns of pain to the right ankle which is been ongoing for the last several months.  He did go to the doctor November 2018 and had an x-ray performed.  He states the pain started in September and November he was told he had tendinitis and he was using Ace ankle bandage as well as Tylenol which helped.  He states that he works at a [employer] and is on his feet all day.  He describes a dull ache to the ankle.  He states that he also has difficulty to move his ankle laterally.  He states it hurts in the front of the ankle.  No recent injury or trauma.  No swelling or redness that he has noticed.  No other complaints today.   Review of Systems  All other systems reviewed and are negative.  Past Medical History:  Diagnosis Date  . Aortic stenosis    a. Echo 12/15 - EF 55-60%, normal WM, Gr 1 DD, mild AS, mild AI, mean AV 18 mmHg  . Arthritis    LEFT KNEE  . Chronic combined systolic and diastolic CHF (congestive heart failure) (HCC)   . H/O hiatal hernia   . History of cardiac catheterization    LHC 12/12:Patent but Ectatic LAD and RCA (? 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  . Hyperlipidemia   . Hypertension   . Hypogonadism male   . Kidney stone    nephrolithiasis  . Nonischemic cardiomyopathy (HCC)   . Second degree AV block    Resolved off of beta blockers  . Sleep apnea     Past Surgical History:  Procedure Laterality Date  . BREATH TEK H PYLORI  12/05/2011   Procedure: BREATH TEK H PYLORI;  Surgeon: Kandis Cocking, MD;  Location: [facility];  Service: General;  Laterality: N/A;  . CARDIAC CATHETERIZATION    . COSMETIC SURGERY     testicle  . HIATAL HERNIA REPAIR  07/09/2012   Procedure: LAPAROSCOPIC REPAIR OF HIATAL HERNIA;  Surgeon: Kandis Cocking, MD;  Location: [facility];  Service: General;;  . LAPAROSCOPIC GASTRIC BANDING  07/09/12  .  LEFT HEART CATHETERIZATION WITH CORONARY ANGIOGRAM N/A 06/01/2011   Procedure: LEFT HEART CATHETERIZATION WITH CORONARY ANGIOGRAM;  Surgeon: Lesleigh Noe, MD;  Location: [facility] CATH LAB;  Service: Cardiovascular;  Laterality: N/A;  . MESH APPLIED TO LAP PORT  07/09/2012   Procedure: MESH APPLIED TO LAP PORT;  Surgeon: Kandis Cocking, MD;  Location: [facility];  Service: General;;     Current Outpatient Medications:  .  anastrozole (ARIMIDEX) 1 MG tablet, TAKE 1 TABLET 1MG  BY MOUTH ONCE DAILY, Disp: , Rfl: 9 .  aspirin 81 MG tablet, Take 81 mg by mouth daily., Disp: , Rfl:  .  glucosamine-chondroitin 500-400 MG tablet, Take 1 tablet by mouth daily., Disp: , Rfl:  .  magnesium oxide (MAG-OX) 400 (241.3 MG) MG tablet, Take 400 mg by mouth daily as needed (FOR CRAMPS). , Disp: , Rfl:  .  naphazoline-pheniramine (NAPHCON-A) 0.025-0.3 % ophthalmic solution, Place 1 drop into both eyes as needed. RED EYES, Disp: , Rfl:  .  spironolactone (ALDACTONE) 25 MG tablet, Take 1 tablet (25 mg total) by mouth daily., Disp: 90 tablet, Rfl: 3 .  testosterone cypionate (DEPOTESTOTERONE CYPIONATE) 200 MG/ML injection, Inject into the muscle every 14 (  fourteen) days. , Disp: , Rfl:  .  valsartan-hydrochlorothiazide (DIOVAN-HCT) 320-25 MG tablet, Take 1 tablet by mouth daily., Disp: 90 tablet, Rfl: 3 .  vardenafil (LEVITRA) 20 MG tablet, Take 20 mg by mouth daily as needed for erectile dysfunction. ED, Disp: , Rfl:   Allergies  Allergen Reactions  . Bystolic [Nebivolol Hcl] Shortness Of Breath    Leg swelling,difficulty breathing  . Atenolol Swelling    Erectile dysfunction    Social History   Socioeconomic History  . Marital status: Married    Spouse name: Not on file  . Number of children: Not on file  . Years of education: Not on file  . Highest education level: Not on file  Social Needs  . Financial resource strain: Not on file  . Food insecurity - worry: Not on file  . Food insecurity - inability: Not  on file  . Transportation needs - medical: Not on file  . Transportation needs - non-medical: Not on file  Occupational History  . Not on file  Tobacco Use  . Smoking status: Never Smoker  . Smokeless tobacco: Never Used  Substance and Sexual Activity  . Alcohol use: No  . Drug use: No  . Sexual activity: Yes  Other Topics Concern  . Not on file  Social History Narrative  . Not on file        Objective:  Physical Exam  General: AAO x3, NAD  Dermatological: Skin is warm, dry and supple bilateral. Nails x 10 are well manicured; remaining integument appears unremarkable at this time. There are no open sores, no preulcerative lesions, no rash or signs of infection present.  Vascular: Dorsalis Pedis artery and Posterior Tibial artery pedal pulses are 2/4 bilateral with immedate capillary fill time. Pedal hair growth present. No varicosities and no lower extremity edema present bilateral. There is no pain with calf compression, swelling, warmth, erythema.   Neruologic: Grossly intact via light touch bilateral. Protective threshold with Semmes Wienstein monofilament intact to all pedal sites bilateral.   Musculoskeletal: There is tenderness palpation on the anterior ankle joint line mostly on the anterior lateral aspect of the ankle joint.  There is no crepitation with ankle joint range of motion.  Is no pain sinus tarsi.  No significant discomfort along the deltoid ligaments of the lateral ankle ligaments.  No pelvic syndesmosis.  No proximal tib-fib pain.  No pain to the fifth metatarsal base, talus or areas of the foot.  Achilles tendon intact.  No significant edema, erythema, increase in warmth.  Muscular strength 5/5 in all groups tested bilateral.  Gait: Unassisted, Nonantalgic.       Assessment:   Capsulitis right ankle    Plan:  -Treatment options discussed including all alternatives, risks, and complications -Etiology of symptoms were discussed -X-rays were obtained and  reviewed with the patient.  No evidence of acute fracture identified today.  I did discuss the x-rays with him and showed him the x-rays today. -Discussed a steroid injection he wishes to proceed.  See procedure note below. -Ice -Ankle brace as needed -Discussed change of shoes as well as potential inserts for his shoes. -His symptoms continue discussed MRI. -Is asking for a note for work.  1 week out of work note was provided today. -Follow-up as scheduled or sooner if needed.  Call any questions or concerns meantime.  He agrees with this plan has no further questions today.  Procedure: Injection intermediate joint Discussed alternatives, risks, complications and verbal consent was  obtained.  Location: Right anterior lateral ankle joint  Skin Prep: Betadine. Injectate: 0.5 cc 0.5% marcaine plain, 0.5 cc 0.5% Marcaine plain and, 1 cc kenalog 10. Disposition: Patient tolerated procedure well. Injection site dressed with a band-aid.  Post-injection care was discussed and return precautions discussed.   Vivi Barrack DPM

## 2017-08-03 ENCOUNTER — Encounter: Payer: Self-pay | Admitting: Podiatry

## 2017-08-03 ENCOUNTER — Ambulatory Visit: Payer: 59 | Admitting: Podiatry

## 2017-08-03 DIAGNOSIS — G8929 Other chronic pain: Secondary | ICD-10-CM | POA: Diagnosis not present

## 2017-08-03 DIAGNOSIS — M25571 Pain in right ankle and joints of right foot: Secondary | ICD-10-CM

## 2017-08-03 DIAGNOSIS — M779 Enthesopathy, unspecified: Secondary | ICD-10-CM | POA: Diagnosis not present

## 2017-08-03 MED ORDER — PREDNISONE 10 MG PO TABS
ORAL_TABLET | ORAL | 0 refills | Status: DC
Start: 2017-08-03 — End: 2018-02-20

## 2017-08-06 ENCOUNTER — Telehealth: Payer: Self-pay | Admitting: *Deleted

## 2017-08-06 DIAGNOSIS — M25571 Pain in right ankle and joints of right foot: Secondary | ICD-10-CM

## 2017-08-06 DIAGNOSIS — M779 Enthesopathy, unspecified: Secondary | ICD-10-CM

## 2017-08-06 DIAGNOSIS — G8929 Other chronic pain: Secondary | ICD-10-CM

## 2017-08-06 NOTE — Telephone Encounter (Signed)
-----   Message from Vivi Barrack, DPM sent at 08/06/2017  8:38 AM EST ----- Can you please order an MRI of the right ankle to rule out out an osteochondral lesion?  Is a chronic ankle pain for the last 6 months without improvement

## 2017-08-06 NOTE — Telephone Encounter (Signed)
Orders to J. Quintana, RN for pre-cert and faxed to [facility]. 

## 2017-08-06 NOTE — Progress Notes (Signed)
Subjective: Mr. Sanker presents the office today for follow-up evaluation of right ankle pain.  He states the injection did help however once he returned to work he started to have recurrence of pain.  He states that he does not work he does not have any pain but when he is on his feet and working this when he gets the majority of symptoms he feels that he cannot do his job properly because of this.  He denies any recent injury or trauma.  This is been ongoing for the last several months.  Occasionally gets some swelling with working but no redness or warmth. Denies any systemic complaints such as fevers, chills, nausea, vomiting. No acute changes since last appointment, and no other complaints at this time.   Objective: AAO x3, NAD DP/PT pulses palpable bilaterally, CRT less than 3 seconds There is tenderness palpation along the anterior ankle joint line on the right ankle.  There is no pain or crepitation with ankle joint range of motion.  There is no pain on the ankle ligaments today.  There is no pain on the syndesmosis or proximal pain.  There is no pain to the foot.  Pain appears to be localized to the ankle joint itself. No open lesions or pre-ulcerative lesions.  No pain with calf compression, swelling, warmth, erythema  Assessment: Right ankle joint capsulitis; rule out osteochondral lesion   Plan: -All treatment options discussed with the patient including all alternatives, risks, complications.  -At this time given his continuation symptoms I recommend an MRI.  Continue with brace as well as supportive shoes.  He feels that he cannot work because of the pain.  We will give him a note to stay at work for the next week but if he needs longer time out of work he will need to likely complete FMLA paperwork.  We will also do a prolonged prednisone taper.  This was prescribed today. -Patient encouraged to call the office with any questions, concerns, change in symptoms.   Vivi Barrack  DPM

## 2017-08-09 ENCOUNTER — Telehealth: Payer: Self-pay | Admitting: *Deleted

## 2017-08-09 NOTE — Telephone Encounter (Signed)
I informed pt that we had sent the orders to [facility] [phone], and he could call to schedule for his convenience.

## 2017-08-09 NOTE — Telephone Encounter (Signed)
Pt states he has not received a call to schedule his MRI.

## 2017-08-18 ENCOUNTER — Ambulatory Visit
Admission: RE | Admit: 2017-08-18 | Discharge: 2017-08-18 | Disposition: A | Discharge status: Home / Self Care | Payer: 59 | Source: Ambulatory Visit | Attending: Podiatry | Admitting: Podiatry

## 2017-08-18 DIAGNOSIS — M25571 Pain in right ankle and joints of right foot: Secondary | ICD-10-CM

## 2017-08-18 DIAGNOSIS — M779 Enthesopathy, unspecified: Secondary | ICD-10-CM

## 2017-08-18 DIAGNOSIS — G8929 Other chronic pain: Secondary | ICD-10-CM

## 2017-08-20 ENCOUNTER — Telehealth: Payer: Self-pay | Admitting: Podiatry

## 2017-08-20 NOTE — Telephone Encounter (Signed)
Attempted to call patient to discuss MRI results No answer Left voicemail to call back .

## 2017-08-22 ENCOUNTER — Telehealth: Payer: Self-pay | Admitting: *Deleted

## 2017-08-22 NOTE — Telephone Encounter (Signed)
Pt states he is returning Dr. Gabriel Rung call from yesterday concerning MRI results.

## 2017-08-23 NOTE — Telephone Encounter (Signed)
I informed pt of Dr. Gabriel Rung review of results and orders, I offered pt an appt and he states he will need to call back tomorrow to schedule.

## 2017-08-23 NOTE — Telephone Encounter (Signed)
Please let him know that the MRI shows some arthritis and tendonitis. We need to work on bracing, orthotics, shoes to help with his pain. Also consider physical therapy. Please let him know what it shows and have him come in to discuss more if he would like. Thanks.

## 2017-12-23 ENCOUNTER — Other Ambulatory Visit: Payer: Self-pay | Admitting: Physician Assistant

## 2018-02-04 ENCOUNTER — Encounter (HOSPITAL_COMMUNITY): Payer: Self-pay

## 2018-02-20 ENCOUNTER — Ambulatory Visit: Payer: 59 | Admitting: Physician Assistant

## 2018-02-20 ENCOUNTER — Encounter: Payer: Self-pay | Admitting: Physician Assistant

## 2018-02-20 VITALS — BP 124/92 | HR 83 | Ht 75.0 in | Wt 335.8 lb

## 2018-02-20 DIAGNOSIS — I10 Essential (primary) hypertension: Secondary | ICD-10-CM | POA: Diagnosis not present

## 2018-02-20 DIAGNOSIS — Q231 Congenital insufficiency of aortic valve: Secondary | ICD-10-CM | POA: Diagnosis not present

## 2018-02-20 DIAGNOSIS — I5042 Chronic combined systolic (congestive) and diastolic (congestive) heart failure: Secondary | ICD-10-CM

## 2018-02-20 DIAGNOSIS — Q230 Congenital stenosis of aortic valve: Secondary | ICD-10-CM

## 2018-02-20 MED ORDER — VALSARTAN-HYDROCHLOROTHIAZIDE 320-25 MG PO TABS: 1.0000 | ORAL_TABLET | Freq: Every day | ORAL | 3 refills | Status: DC

## 2018-02-20 MED ORDER — SPIRONOLACTONE 25 MG PO TABS: 25.0000 mg | ORAL_TABLET | Freq: Every day | ORAL | 3 refills | Status: DC

## 2018-02-20 NOTE — Progress Notes (Signed)
Cardiology Office Note:    Date:  02/20/2018   ID:  Joshua [name], DOB 01/07/1966, MRN [id number]  PCP:  Johny Blamer, MD  Cardiologist:  Lesleigh Noe, MD   Electrophysiologist:  None   Referring MD: Johny Blamer, MD   Chief Complaint  Patient presents with  . Follow-up    CHF, HTN, aortic stenosis     History of Present Illness:    Joshua [name] is a 52 y.o. 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  He was last seen in July 2018.    Mr. Joshua [name] returns for annual follow up.  He is here alone.  I ordered an echocardiogram at his last visit. He had to cancel this because his insurance would not pay.  He has not had chest pain, shortness of breath, syncope, paroxysmal nocturnal dyspnea, leg swelling.    Prior CV studies:   The following studies were reviewed today:    LHC (12/12):   Patent but Ectatic LAD and RCA (? 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     Echo (12/15):   EF 55-60%, normal WM, Gr 1 DD, mild AS, mild AI, mean AV 18 mmHg   Past Medical History:  Diagnosis Date  . Aortic stenosis    a. Echo 12/15 - EF 55-60%, normal WM, Gr 1 DD, mild AS, mild AI, mean AV 18 mmHg  . Arthritis    LEFT KNEE  . Chronic combined systolic and diastolic CHF (congestive heart failure) (HCC)   . H/O hiatal hernia   . History of cardiac catheterization    LHC 12/12:Patent but Ectatic LAD and RCA (? 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  . Hyperlipidemia   . Hypertension   . Hypogonadism male   . Kidney stone    nephrolithiasis  . Nonischemic cardiomyopathy (HCC)   . Second degree AV block    Resolved off of beta blockers  . Sleep apnea    Surgical Hx: The patient  has a past surgical history that includes Cardiac catheterization; Cosmetic surgery; Breath tek h  pylori (12/05/2011); Mesh applied to lap port (07/09/2012); Hiatal hernia repair (07/09/2012); Laparoscopic gastric banding (07/09/12); and left heart catheterization with coronary angiogram (N/A, 06/01/2011).   Current Medications: Current Meds  Medication Sig  . anastrozole (ARIMIDEX) 1 MG tablet TAKE 1 TABLET 1MG  BY MOUTH ONCE DAILY  . aspirin 81 MG tablet Take 81 mg by mouth daily.  Marland Kitchen glucosamine-chondroitin 500-400 MG tablet Take 1 tablet by mouth daily.  . magnesium oxide (MAG-OX) 400 (241.3 MG) MG tablet Take 400 mg by mouth daily as needed (FOR CRAMPS).   . naphazoline-pheniramine (NAPHCON-A) 0.025-0.3 % ophthalmic solution Place 1 drop into both eyes as needed. RED EYES  . spironolactone (ALDACTONE) 25 MG tablet Take 1 tablet (25 mg total) by mouth daily.  . tamsulosin (FLOMAX) 0.4 MG CAPS capsule Take 0.4 mg by mouth.  . testosterone cypionate (DEPOTESTOTERONE CYPIONATE) 200 MG/ML injection Inject into the muscle every 14 (fourteen) days.   . valsartan-hydrochlorothiazide (DIOVAN-HCT) 320-25 MG tablet Take 1 tablet by mouth daily.  . vardenafil (LEVITRA) 20 MG tablet Take 20 mg by mouth daily as needed for erectile dysfunction. ED  . [DISCONTINUED] spironolactone (ALDACTONE) 25 MG tablet Take 1 tablet (25 mg total) by mouth daily. PLEASE SCHEDULE FOLLOW  UP APPOINTMENT  . [DISCONTINUED] valsartan-hydrochlorothiazide (DIOVAN-HCT) 320-25 MG tablet Take 1 tablet by mouth daily. PLEASE SCHEDULE FOLLOW UP APPOINTMENT     Allergies:   Bystolic [nebivolol hcl] and Atenolol   Social History   Tobacco Use  . Smoking status: Never Smoker  . Smokeless tobacco: Never Used  Substance Use Topics  . Alcohol use: No  . Drug use: No     Family Hx: The patient's family history includes Cancer in his sister; Heart disease in his father; Hypertension in his brother; Stroke in his mother. There is no history of Heart attack.  ROS:   Please see the history of present illness.    ROS All other  systems reviewed and are negative.   EKGs/Labs/Other Test Reviewed:    EKG:  EKG is  ordered today.  The ekg ordered today demonstrates normal sinus rhythm, HR 75, normal axis, QTc 417 ms, similar to old ECGs  Recent Labs: No results found for requested labs within last 8760 hours.   Recent Lipid Panel Lab Results  Component Value Date/Time   CHOL 145 11/27/2011 09:33 AM   TRIG 66 11/27/2011 09:33 AM   HDL 40 11/27/2011 09:33 AM   CHOLHDL 3.6 11/27/2011 09:33 AM   LDLCALC 92 11/27/2011 09:33 AM    Physical Exam:    VS:  BP (!) 124/92   Pulse 83   Ht 6\' 3"  (1.905 m)   Wt (!) 335 lb 12 oz (152.3 kg)   SpO2 98%   BMI 41.97 kg/m     Wt Readings from Last 3 Encounters:  02/20/18 (!) 335 lb 12 oz (152.3 kg)  01/01/17 (!) 329 lb 6.4 oz (149.4 kg)  11/18/15 (!) 303 lb 6.4 oz (137.6 kg)     Physical Exam  Constitutional: He is oriented to person, place, and time. He appears well-developed and well-nourished. No distress.  HENT:  Head: Normocephalic and atraumatic.  Eyes: No scleral icterus.  Neck: No JVD present. No thyromegaly present.  Cardiovascular: Normal rate and regular rhythm.  Murmur heard.  Crescendo-decrescendo systolic murmur is present with a grade of 3/6 at the upper right sternal border. Pulmonary/Chest: Effort normal. He has no wheezes. He has no rales.  Abdominal: Soft. He exhibits no distension.  Musculoskeletal: He exhibits edema (trace bilat LE edema).  Lymphadenopathy:    He has no cervical adenopathy.  Neurological: He is alert and oriented to person, place, and time.  Skin: Skin is warm and dry.  Psychiatric: He has a normal mood and affect.    ASSESSMENT & PLAN:    Aortic stenosis with bicuspid valve Mild by echo in 2015.  No follow up echocardiogram since then.  He has not had symptoms to suggest severe or critical aortic stenosis.  We discussed the importance of obtaining a follow up echocardiogram.  I will ask our precert department to get  it approved. If his insurance will not cover it, I will contact them personally.  -Obtain echocardiogram   Chronic combined systolic and diastolic CHF (congestive heart failure) (HCC) EF previously 40-45 and improved to normal.  He is NYHA 1.  Volume is stable.  He is not on beta-blocker due to a hx of bradycardia.  Continue ARB, spironolactone.    Essential hypertension BP above target.  I have recommended he limit his salt and monitor his BP.  We will need to consider adding Amlodipine 2.5 mg QD if his BP does not reach target.    -Low salt diet  -  Record blood pressure daily and send readings after 3 weeks  -Follow up in HTN clinic in 3 mos   Dispo:  Return in about 1 year (around 02/21/2019) for Routine Follow Up w/ Dr. Katrinka Blazing, or Tereso Newcomer, PA-C.   Medication Adjustments/Labs and Tests Ordered: Current medicines are reviewed at length with the patient today.  Concerns regarding medicines are outlined above.  Tests Ordered: Orders Placed This Encounter  Procedures  . EKG 12-Lead  . ECHOCARDIOGRAM COMPLETE   Medication Changes: Meds ordered this encounter  Medications  . spironolactone (ALDACTONE) 25 MG tablet    Sig: Take 1 tablet (25 mg total) by mouth daily.    Dispense:  90 tablet    Refill:  3  . valsartan-hydrochlorothiazide (DIOVAN-HCT) 320-25 MG tablet    Sig: Take 1 tablet by mouth daily.    Dispense:  90 tablet    Refill:  3    Signed, Tereso Newcomer, PA-C  02/20/2018 5:32 PM    [facility] [address] [place], [place], [place]  [address] Phone: [phone]; Fax: [phone]

## 2018-02-20 NOTE — Patient Instructions (Signed)
Medication Instructions:  REFILLS HAVE BEEN SENT IN FOR SPIRONOLACTONE, DIOVAN TO [facility]   Labwork: NONE ORDERED TODAY  Testing/Procedures: Your physician has requested that you have an echocardiogram. Echocardiography is a painless test that uses sound waves to create images of your heart. It provides your doctor with information about the size and shape of your heart and how well your heart's chambers and valves are working. This procedure takes approximately one hour. There are no restrictions for this procedure. THIS IS NOT TO BE SCHEDULED UNTIL INSURANCE HAS APPROVED FOR TESTING    Follow-Up: DR. Katrinka Blazing IN 1 YEAR  YOU ARE BEING REFERRED TO THE HYPERTENSION CLINIC TO BE SEEN IN 3 MONTHS   Any Other Special Instructions Will Be Listed Below (If Applicable).  CHECK BLOOD PRESSURE DAILY FOR 3 WEEKS AND SEND READINGS TO [place] [phone]  If you need a refill on your cardiac medications before your next appointment, please call your pharmacy.   Low-Sodium Eating Plan Sodium, which is an element that makes up salt, helps you maintain a healthy balance of fluids in your body. Too much sodium can increase your blood pressure and cause fluid and waste to be held in your body. Your health care provider or dietitian may recommend following this plan if you have high blood pressure (hypertension), kidney disease, liver disease, or heart failure. Eating less sodium can help lower your blood pressure, reduce swelling, and protect your heart, liver, and kidneys. What are tips for following this plan? General guidelines  Most people on this plan should limit their sodium intake to 1,500-2,000 mg (milligrams) of sodium each day. Reading food labels  The Nutrition Facts label lists the amount of sodium in one serving of the food. If you eat more than one serving, you must multiply the listed amount of sodium by the number of servings.  Choose foods with less than 140 mg of  sodium per serving.  Avoid foods with 300 mg of sodium or more per serving. Shopping  Look for lower-sodium products, often labeled as "low-sodium" or "no salt added."  Always check the sodium content even if foods are labeled as "unsalted" or "no salt added".  Buy fresh foods. ? Avoid canned foods and premade or frozen meals. ? Avoid canned, cured, or processed meats  Buy breads that have less than 80 mg of sodium per slice. Cooking  Eat more home-cooked food and less restaurant, buffet, and fast food.  Avoid adding salt when cooking. Use salt-free seasonings or herbs instead of table salt or sea salt. Check with your health care provider or pharmacist before using salt substitutes.  Cook with plant-based oils, such as canola, sunflower, or olive oil. Meal planning  When eating at a restaurant, ask that your food be prepared with less salt or no salt, if possible.  Avoid foods that contain MSG (monosodium glutamate). MSG is sometimes added to [place] food, bouillon, and some canned foods. What foods are recommended? The items listed may not be a complete list. Talk with your dietitian about what dietary choices are best for you. Grains Low-sodium cereals, including oats, puffed wheat and rice, and shredded wheat. Low-sodium crackers. Unsalted rice. Unsalted pasta. Low-sodium bread. Whole-grain breads and whole-grain pasta. Vegetables Fresh or frozen vegetables. "No salt added" canned vegetables. "No salt added" tomato sauce and paste. Low-sodium or reduced-sodium tomato and vegetable juice. Fruits Fresh, frozen, or canned fruit. Fruit juice. Meats and other protein foods Fresh or frozen (no salt added) meat, poultry,  seafood, and fish. Low-sodium canned tuna and salmon. Unsalted nuts. Dried peas, beans, and lentils without added salt. Unsalted canned beans. Eggs. Unsalted nut butters. Dairy Milk. Soy milk. Cheese that is naturally low in sodium, such as ricotta cheese, fresh  mozzarella, or Swiss cheese Low-sodium or reduced-sodium cheese. Cream cheese. Yogurt. Fats and oils Unsalted butter. Unsalted margarine with no trans fat. Vegetable oils such as canola or olive oils. Seasonings and other foods Fresh and dried herbs and spices. Salt-free seasonings. Low-sodium mustard and ketchup. Sodium-free salad dressing. Sodium-free light mayonnaise. Fresh or refrigerated horseradish. Lemon juice. Vinegar. Homemade, reduced-sodium, or low-sodium soups. Unsalted popcorn and pretzels. Low-salt or salt-free chips. What foods are not recommended? The items listed may not be a complete list. Talk with your dietitian about what dietary choices are best for you. Grains Instant hot cereals. Bread stuffing, pancake, and biscuit mixes. Croutons. Seasoned rice or pasta mixes. Noodle soup cups. Boxed or frozen macaroni and cheese. Regular salted crackers. Self-rising flour. Vegetables Sauerkraut, pickled vegetables, and relishes. Olives. [place] fries. Onion rings. Regular canned vegetables (not low-sodium or reduced-sodium). Regular canned tomato sauce and paste (not low-sodium or reduced-sodium). Regular tomato and vegetable juice (not low-sodium or reduced-sodium). Frozen vegetables in sauces. Meats and other protein foods Meat or fish that is salted, canned, smoked, spiced, or pickled. Bacon, ham, sausage, hotdogs, corned beef, chipped beef, packaged lunch meats, salt pork, jerky, pickled herring, anchovies, regular canned tuna, sardines, salted nuts. Dairy Processed cheese and cheese spreads. Cheese curds. Blue cheese. Feta cheese. String cheese. Regular cottage cheese. Buttermilk. Canned milk. Fats and oils Salted butter. Regular margarine. Ghee. Bacon fat. Seasonings and other foods Onion salt, garlic salt, seasoned salt, table salt, and sea salt. Canned and packaged gravies. Worcestershire sauce. Tartar sauce. Barbecue sauce. Teriyaki sauce. Soy sauce, including reduced-sodium.  Steak sauce. Fish sauce. Oyster sauce. Cocktail sauce. Horseradish that you find on the shelf. Regular ketchup and mustard. Meat flavorings and tenderizers. Bouillon cubes. Hot sauce and Tabasco sauce. Premade or packaged marinades. Premade or packaged taco seasonings. Relishes. Regular salad dressings. Salsa. Potato and tortilla chips. Corn chips and puffs. Salted popcorn and pretzels. Canned or dried soups. Pizza. Frozen entrees and pot pies. Summary  Eating less sodium can help lower your blood pressure, reduce swelling, and protect your heart, liver, and kidneys.  Most people on this plan should limit their sodium intake to 1,500-2,000 mg (milligrams) of sodium each day.  Canned, boxed, and frozen foods are high in sodium. Restaurant foods, fast foods, and pizza are also very high in sodium. You also get sodium by adding salt to food.  Try to cook at home, eat more fresh fruits and vegetables, and eat less fast food, canned, processed, or prepared foods. This information is not intended to replace advice given to you by your health care provider. Make sure you discuss any questions you have with your health care provider. Document Released: 11/18/2001 Document Revised: 05/22/2016 Document Reviewed: 05/22/2016 Elsevier Interactive Patient Education  [employer].

## 2018-02-22 ENCOUNTER — Other Ambulatory Visit: Payer: Self-pay | Admitting: Physician Assistant

## 2018-03-12 ENCOUNTER — Ambulatory Visit (HOSPITAL_COMMUNITY): Payer: 59 | Attending: Physician Assistant

## 2018-03-12 ENCOUNTER — Other Ambulatory Visit: Payer: Self-pay

## 2018-03-12 ENCOUNTER — Other Ambulatory Visit: Payer: Self-pay | Admitting: Physician Assistant

## 2018-03-12 ENCOUNTER — Encounter: Payer: Self-pay | Admitting: Physician Assistant

## 2018-03-12 DIAGNOSIS — Q230 Congenital stenosis of aortic valve: Secondary | ICD-10-CM

## 2018-03-12 DIAGNOSIS — I5042 Chronic combined systolic (congestive) and diastolic (congestive) heart failure: Secondary | ICD-10-CM

## 2018-03-12 DIAGNOSIS — Q231 Congenital insufficiency of aortic valve: Secondary | ICD-10-CM | POA: Diagnosis not present

## 2018-03-12 MED ORDER — SPIRONOLACTONE 25 MG PO TABS: 25.0000 mg | ORAL_TABLET | Freq: Every day | ORAL | 3 refills | Status: DC

## 2018-03-12 NOTE — Addendum Note (Signed)
Addended by: Michaelle Copas on: 03/12/2018 05:17 PM   Modules accepted: Orders

## 2018-03-13 ENCOUNTER — Telehealth: Payer: Self-pay

## 2018-03-13 NOTE — Telephone Encounter (Signed)
Notes recorded by Sigurd Sos, RN on 03/13/2018 at 1:52 PM EDT [id number] 10/2

## 2018-03-13 NOTE — Telephone Encounter (Signed)
-----   Message from Beatrice Lecher, [place] sent at 03/12/2018 12:59 PM EDT ----- This study demonstrates:  Normal heart function (ejection fraction) and mildly impaired relaxation (diastolic dysfunction).  The aortic valve is mildly stiff (aortic stenosis).  There has been no significant change since the last echocardiogram. Medication changes / Follow up studies / Other recommendations:    - Continue current medications and follow up as planned.  Please send results to the PCP:  Johny Blamer, MD  Tereso Newcomer, PA-C 03/12/2018 12:57 PM

## 2018-03-27 ENCOUNTER — Other Ambulatory Visit: Payer: Self-pay

## 2018-03-27 ENCOUNTER — Encounter (HOSPITAL_COMMUNITY): Payer: Self-pay | Admitting: *Deleted

## 2018-03-27 ENCOUNTER — Emergency Department (HOSPITAL_COMMUNITY)
Admission: EM | Admit: 2018-03-27 | Discharge: 2018-03-27 | Disposition: A | Discharge status: Home / Self Care | Payer: 59 | Attending: Emergency Medicine | Admitting: Emergency Medicine

## 2018-03-27 DIAGNOSIS — I10 Essential (primary) hypertension: Secondary | ICD-10-CM | POA: Insufficient documentation

## 2018-03-27 DIAGNOSIS — R1012 Left upper quadrant pain: Secondary | ICD-10-CM | POA: Diagnosis present

## 2018-03-27 DIAGNOSIS — R109 Unspecified abdominal pain: Secondary | ICD-10-CM

## 2018-03-27 DIAGNOSIS — Z79899 Other long term (current) drug therapy: Secondary | ICD-10-CM | POA: Diagnosis not present

## 2018-03-27 DIAGNOSIS — I110 Hypertensive heart disease with heart failure: Secondary | ICD-10-CM | POA: Insufficient documentation

## 2018-03-27 DIAGNOSIS — I5040 Unspecified combined systolic (congestive) and diastolic (congestive) heart failure: Secondary | ICD-10-CM | POA: Diagnosis not present

## 2018-03-27 DIAGNOSIS — Z7982 Long term (current) use of aspirin: Secondary | ICD-10-CM | POA: Insufficient documentation

## 2018-03-27 LAB — URINALYSIS, ROUTINE W REFLEX MICROSCOPIC
Bilirubin Urine: NEGATIVE
Glucose, UA: NEGATIVE mg/dL
HGB URINE DIPSTICK: NEGATIVE
Ketones, ur: NEGATIVE mg/dL
LEUKOCYTES UA: NEGATIVE
NITRITE: NEGATIVE
PH: 6 (ref 5.0–8.0)
Protein, ur: 100 mg/dL — AB
SPECIFIC GRAVITY, URINE: 1.019 (ref 1.005–1.030)

## 2018-03-27 MED ORDER — ONDANSETRON 4 MG PO TBDP: 4.0000 mg | ORAL_TABLET | Freq: Four times a day (QID) | ORAL | 0 refills | Status: DC | PRN

## 2018-03-27 MED ORDER — ONDANSETRON 4 MG PO TBDP
4.0000 mg | ORAL_TABLET | Freq: Once | ORAL | Status: AC
  Administered 2018-03-27: 4 mg via ORAL
  Filled 2018-03-27: qty 1

## 2018-03-27 MED ORDER — KETOROLAC TROMETHAMINE 30 MG/ML IJ SOLN
60.0000 mg | Freq: Once | INTRAMUSCULAR | Status: AC
  Administered 2018-03-27: 60 mg via INTRAMUSCULAR
  Filled 2018-03-27: qty 2

## 2018-03-27 MED ORDER — IBUPROFEN 800 MG PO TABS: 800.0000 mg | ORAL_TABLET | Freq: Three times a day (TID) | ORAL | 0 refills | Status: DC | PRN

## 2018-03-27 MED ORDER — HYDROCODONE-ACETAMINOPHEN 5-325 MG PO TABS: 2.0000 | ORAL_TABLET | Freq: Four times a day (QID) | ORAL | 0 refills | Status: DC | PRN

## 2018-03-27 NOTE — ED Notes (Signed)
Bed: WA05 Expected date:  Expected time:  Means of arrival:  Comments: 

## 2018-03-27 NOTE — ED Notes (Signed)
Pt now c/o nausea. 

## 2018-03-27 NOTE — ED Provider Notes (Signed)
TIME SEEN: 3:00 AM  CHIEF COMPLAINT: Left flank pain  HPI: Patient is a 52 year old male with history of hypertension, hyperlipidemia, previous kidney stones who presents to the emergency part with left flank pain that started a few hours prior to arrival.  Has had nausea but no vomiting.  Symptoms feel similar to his previous kidney stones.  Denies ever having to have any surgery for his kidney stones.  No dysuria or hematuria.  No abdominal pain.  ROS: See HPI Constitutional: no fever  Eyes: no drainage  ENT: no runny nose   Cardiovascular:  no chest pain  Resp: no SOB  GI: no vomiting GU: no dysuria Integumentary: no rash  Allergy: no hives  Musculoskeletal: no leg swelling  Neurological: no slurred speech ROS otherwise negative  PAST MEDICAL HISTORY/PAST SURGICAL HISTORY:  Past Medical History:  Diagnosis Date  . Aortic stenosis    a. Echo 12/15 - EF 55-60%, normal WM, Gr 1 DD, mild AS, mild AI, mean AV 18 mmHg // Echo 03/12/2018: mild LVH, EF 65-70, no RWMA, Gr 1 DD, bicuspid AV, mild AS (mean 17, peak 28), mild AI   . Arthritis    LEFT KNEE  . Chronic combined systolic and diastolic CHF (congestive heart failure) (HCC)   . H/O hiatal hernia   . History of cardiac catheterization    LHC 12/12:Patent but Ectatic LAD and RCA (? 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  . Hyperlipidemia   . Hypertension   . Hypogonadism male   . Kidney stone    nephrolithiasis  . Nonischemic cardiomyopathy (HCC)   . Second degree AV block    Resolved off of beta blockers  . Sleep apnea     MEDICATIONS:  Prior to Admission medications   Medication Sig Start Date End Date Taking? Authorizing Provider  anastrozole (ARIMIDEX) 1 MG tablet TAKE 1 TABLET 1MG  BY MOUTH ONCE DAILY 07/30/15   [provider]  aspirin 81 MG tablet Take 81 mg by mouth daily.    [provider]  glucosamine-chondroitin 500-400 MG tablet Take 1 tablet by mouth daily.    [provider]   magnesium oxide (MAG-OX) 400 (241.3 MG) MG tablet Take 400 mg by mouth daily as needed (FOR CRAMPS).     [provider]  naphazoline-pheniramine (NAPHCON-A) 0.025-0.3 % ophthalmic solution Place 1 drop into both eyes as needed. RED EYES    [provider]  spironolactone (ALDACTONE) 25 MG tablet Take 1 tablet (25 mg total) by mouth daily. 03/12/18   Lyn Records, MD  tamsulosin (FLOMAX) 0.4 MG CAPS capsule Take 0.4 mg by mouth.    [provider]  testosterone cypionate (DEPOTESTOTERONE CYPIONATE) 200 MG/ML injection Inject into the muscle every 14 (fourteen) days.     [provider]  valsartan-hydrochlorothiazide (DIOVAN-HCT) 320-25 MG tablet Take 1 tablet by mouth daily. 02/20/18   Tereso Newcomer T, PA-C  valsartan-hydrochlorothiazide (DIOVAN-HCT) 320-25 MG tablet Take 1 tablet by mouth daily. 02/22/18   Tereso Newcomer T, PA-C  vardenafil (LEVITRA) 20 MG tablet Take 20 mg by mouth daily as needed for erectile dysfunction. ED    [provider]    ALLERGIES:  Allergies  Allergen Reactions  . Bystolic [Nebivolol Hcl] Shortness Of Breath    Leg swelling,difficulty breathing  . Atenolol Swelling    Erectile dysfunction    SOCIAL HISTORY:  Social History   Tobacco Use  . Smoking status: Never Smoker  . Smokeless tobacco: Never Used  Substance  Use Topics  . Alcohol use: No    FAMILY HISTORY: Family History  Problem Relation Age of Onset  . Stroke Mother   . Heart disease Father   . Cancer Sister        breast  . Hypertension Brother   . Heart attack Neg Hx     EXAM: BP 122/83 (BP Location: Right Arm)   Pulse 77   Temp 98 F (36.7 C) (Oral)   Resp 18   Ht 6\' 3"  (1.905 m)   Wt (!) 145.2 kg   SpO2 96%   BMI 40.00 kg/m  CONSTITUTIONAL: Alert and oriented and responds appropriately to questions.  Appears mildly uncomfortable but in no distress, obese HEAD: Normocephalic EYES: Conjunctivae clear, pupils appear equal, EOMI ENT:  normal nose; moist mucous membranes NECK: Supple, no meningismus, no nuchal rigidity, no LAD  CARD: RRR; S1 and S2 appreciated; no murmurs, no clicks, no rubs, no gallops RESP: Normal chest excursion without splinting or tachypnea; breath sounds clear and equal bilaterally; no wheezes, no rhonchi, no rales, no hypoxia or respiratory distress, speaking full sentences ABD/GI: Normal bowel sounds; non-distended; soft, non-tender, no rebound, no guarding, no peritoneal signs, no hepatosplenomegaly BACK:  The back appears normal and is non-tender to palpation, there is no CVA tenderness EXT: Normal ROM in all joints; non-tender to palpation; no edema; normal capillary refill; no cyanosis, no calf tenderness or swelling    SKIN: Normal color for age and race; warm; no rash NEURO: Moves all extremities equally PSYCH: The patient's mood and manner are appropriate. Grooming and personal hygiene are appropriate.  MEDICAL DECISION MAKING: Patient here with left flank pain that feels similar to previous kidney stones.  Will give Toradol, Zofran and reassess.  Urinalysis pending.  Abdominal exam benign.  Doubt dissection, colitis, diverticulitis, appendicitis.  ED PROGRESS: Labs have improved with IM Toradol and ODT Zofran.  Patient's urine shows no sign of infection.  Will discharge home with outpatient urology follow-up and with prescription of ibuprofen, Vicodin, Zofran.  Patient verbalized understanding and is comfortable with this plan.   At this time, I do not feel there is any life-threatening condition present. I have reviewed and discussed all results (EKG, imaging, lab, urine as appropriate) and exam findings with patient/family. I have reviewed nursing notes and appropriate previous records.  I feel the patient is safe to be discharged home without further emergent workup and can continue workup as an outpatient as needed. Discussed usual and customary return precautions. Patient/family verbalize  understanding and are comfortable with this plan.  Outpatient follow-up has been provided if needed. All questions have been answered.      Haisley Arens, Layla Maw, DO 03/27/18 Jeralyn Bennett

## 2018-03-27 NOTE — Discharge Instructions (Signed)
Your urine was normal today with no sign of infection.  If you continue to have symptoms in 1 week, please follow-up with urology as an outpatient.  If your pain becomes uncontrolled, you cannot urinate for more than 6 hours, you have vomiting and cannot stop, you have fever of 101 or higher, please return to the ED.

## 2018-03-27 NOTE — ED Triage Notes (Signed)
Pt c/o left flank pain that started "a couple of hours ago."  Hx of kidney stones.  Pt denies dysuria.

## 2018-05-14 ENCOUNTER — Ambulatory Visit
Admission: RE | Admit: 2018-05-14 | Discharge: 2018-05-14 | Disposition: A | Discharge status: Home / Self Care | Payer: 59 | Source: Ambulatory Visit | Attending: Family Medicine | Admitting: Family Medicine

## 2018-05-14 ENCOUNTER — Other Ambulatory Visit: Payer: Self-pay | Admitting: Family Medicine

## 2018-05-14 DIAGNOSIS — J90 Pleural effusion, not elsewhere classified: Secondary | ICD-10-CM

## 2018-05-21 ENCOUNTER — Ambulatory Visit: Payer: Self-pay

## 2018-05-23 ENCOUNTER — Ambulatory Visit (INDEPENDENT_AMBULATORY_CARE_PROVIDER_SITE_OTHER): Payer: 59 | Admitting: Pharmacist

## 2018-05-23 ENCOUNTER — Encounter: Payer: Self-pay | Admitting: Pharmacist

## 2018-05-23 VITALS — BP 122/82 | HR 83

## 2018-05-23 DIAGNOSIS — I10 Essential (primary) hypertension: Secondary | ICD-10-CM

## 2018-05-23 NOTE — Progress Notes (Signed)
Patient ID: Joshua [name]                 DOB: Aug 20, 1965                      MRN: [id number]     HPI: Joshua [name] is a 52 y.o. male patient of Dr. Katrinka Blazing who presents today for hypertension evaluation. 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 He was last seen by Tereso Newcomer, PA in Sept and it was recommend to limit sodium and monitor pressures. He presents today for blood pressure management.  He states he is doing well on his current regimen. He states that his pressures were high for a few weeks after seeing Scott in Sept, but have improved since. He states that when he was on a higher dose of spironolactone in the past he developed low blood pressure.    Current HTN meds:  Valsartan/HCTZ 320/25 mg daily Spironolactone 25mg  daily   Previously tried: Beta blocker (unknown tried a long time ago - short of breath), Norvasc (swelling in legs)  BP goal: <130/80  Family History: Cancer in his sister; Heart disease in his father; Hypertension in his brother; Stroke in his mother. There is no history of Heart attack  Social History: denies tobacco and alcohol  Diet: Eats out most of the time. He eats mostly fast food. He does not add salt to his food. He drinks mostly unsweet tea. He usually has 1 cup of coffee per weekend.   Exercise: He has not been going to gym recently due to ankle.   Home BP readings: 130/80 - primary care office a few days ago  Wt Readings from Last 3 Encounters:  03/27/18 (!) 320 lb (145.2 kg)  02/20/18 (!) 335 lb 12 oz (152.3 kg)  01/01/17 (!) 329 lb 6.4 oz (149.4 kg)   BP Readings from Last 3 Encounters:  05/23/18 122/82  03/27/18 101/66  02/20/18 (!) 124/92   Pulse Readings from Last 3 Encounters:  05/23/18 83  03/27/18 87  02/20/18 83    Renal  function: CrCl cannot be calculated (Patient's most recent lab result is older than the maximum 21 days allowed.).  Past Medical History:  Diagnosis Date  . Aortic stenosis    a. Echo 12/15 - EF 55-60%, normal WM, Gr 1 DD, mild AS, mild AI, mean AV 18 mmHg // Echo 03/12/2018: mild LVH, EF 65-70, no RWMA, Gr 1 DD, bicuspid AV, mild AS (mean 17, peak 28), mild AI   . Arthritis    LEFT KNEE  . Chronic combined systolic and diastolic CHF (congestive heart failure) (HCC)   . H/O hiatal hernia   . History of cardiac catheterization    LHC 12/12:Patent but Ectatic LAD and RCA (? 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  . Hyperlipidemia   . Hypertension   . Hypogonadism male   . Kidney stone    nephrolithiasis  . Nonischemic cardiomyopathy (HCC)   . Second degree AV block    Resolved off of beta blockers  . Sleep apnea     Current Outpatient Medications on File Prior to Visit  Medication Sig Dispense Refill  . anastrozole (ARIMIDEX) 1 MG tablet Take 1 mg by mouth daily.   9  . aspirin 81 MG tablet Take  81 mg by mouth daily.    . cefUROXime (CEFTIN) 500 MG tablet Take 500 mg by mouth 2 (two) times daily with a meal. For 10 days    . Glucosamine-Chondroit-Vit C-Mn (GLUCOSAMINE CHONDR 500 COMPLEX PO) Take 2 capsules by mouth daily.    Marland Kitchen HYDROcodone-acetaminophen (NORCO/VICODIN) 5-325 MG tablet Take 2 tablets by mouth every 6 (six) hours as needed. 16 tablet 0  . magnesium oxide (MAG-OX) 400 (241.3 MG) MG tablet Take 400 mg by mouth daily as needed (FOR CRAMPS).     . Multiple Vitamin (MULTIVITAMIN WITH MINERALS) TABS tablet Take 1 tablet by mouth daily.    . naphazoline-pheniramine (NAPHCON-A) 0.025-0.3 % ophthalmic solution Place 1 drop into both eyes as needed. RED EYES    . spironolactone (ALDACTONE) 25 MG tablet Take 1 tablet (25 mg total) by mouth daily. 90 tablet 3  . tamsulosin (FLOMAX) 0.4 MG CAPS capsule Take 0.4 mg by mouth daily.     Marland Kitchen testosterone cypionate (DEPOTESTOTERONE  CYPIONATE) 200 MG/ML injection Inject into the muscle every 14 (fourteen) days.     . valsartan-hydrochlorothiazide (DIOVAN-HCT) 320-25 MG tablet Take 1 tablet by mouth daily. 90 tablet 3  . vardenafil (LEVITRA) 20 MG tablet Take 20 mg by mouth daily as needed for erectile dysfunction. ED     No current facility-administered medications on file prior to visit.     Allergies  Allergen Reactions  . Bystolic [Nebivolol Hcl] Shortness Of Breath    Leg swelling,difficulty breathing  . Atenolol Swelling    Erectile dysfunction    Blood pressure 122/82, pulse 83.   Assessment/Plan: Hypertension: BP today is borderline at goal. Will continue current medication therapy and encouraged increased exercise. Advised to continue to monitor and follow up in HTN clinic if needed. Follow up with Dr. Katrinka Blazing as scheduled.    Thank you, Joshua [name]. Joshua [name], PharmD  [facility]  05/23/2018 4:23 PM

## 2018-05-23 NOTE — Patient Instructions (Signed)
Return for a follow up appointment as recommended with Dr. Katrinka Blazing   Your blood pressure goal is less than 130/80  Check your blood pressure at home daily (if able) and keep record of the readings.  Take your BP meds as follows: CONTINUE medications as prescribed.   Bring all of your meds, your BP cuff and your record of home blood pressures to your next appointment.  Exercise as you're able, try to walk approximately 30 minutes per day.  Keep salt intake to a minimum, especially watch canned and prepared boxed foods.  Eat more fresh fruits and vegetables and fewer canned items.  Avoid eating in fast food restaurants.    HOW TO TAKE YOUR BLOOD PRESSURE: . Rest 5 minutes before taking your blood pressure. .  Don't smoke or drink caffeinated beverages for at least 30 minutes before. . Take your blood pressure before (not after) you eat. . Sit comfortably with your back supported and both feet on the floor (don't cross your legs). . Elevate your arm to heart level on a table or a desk. . Use the proper sized cuff. It should fit smoothly and snugly around your bare upper arm. There should be enough room to slip a fingertip under the cuff. The bottom edge of the cuff should be 1 inch above the crease of the elbow. . Ideally, take 3 measurements at one sitting and record the average.

## 2018-06-20 ENCOUNTER — Encounter: Payer: Self-pay | Admitting: Internal Medicine

## 2018-06-20 ENCOUNTER — Ambulatory Visit: Payer: 59 | Admitting: Internal Medicine

## 2018-06-20 VITALS — BP 112/80 | HR 74 | Ht 75.0 in | Wt 339.6 lb

## 2018-06-20 DIAGNOSIS — R0609 Other forms of dyspnea: Secondary | ICD-10-CM | POA: Diagnosis not present

## 2018-06-20 DIAGNOSIS — J90 Pleural effusion, not elsewhere classified: Secondary | ICD-10-CM | POA: Diagnosis not present

## 2018-06-20 NOTE — Patient Instructions (Addendum)
Please remember to go to the lab department   for your tests - we will call you with the results when they are available.      Please schedule a follow up office visit in 4 weeks, sooner if needed with cxr on return

## 2018-06-20 NOTE — Progress Notes (Signed)
Joshua [name], male    DOB: [birth year],     MRN: [id number]     Brief patient profile:  15 yobm never smoker some rhinitis as child never outgrew but never needed anything more than clariton D in spring time with freq kidney stones since teenage years  With recurrence 03/27/18 severe pain on L flank assoc with very dark urine then sob with breathing >  CT 04/18/18 c/w small effusion still present on cxr 05/14/18 with atx and ? pna> abx x 10 days seemed to help with pain down from a 10 to a 2 but still mild doe and can feel a tightness/pulling on L when tried to take a deep breath so referred to pulmonary clinic 06/20/2018 by Dr   Joshua [name]   Note h/o AS, AI but LA nl size 03/12/18    06/20/2018  Pulmonary/ 1st office eval/Joshua [name]  Chief Complaint  Patient presents with  . Pulmonary Consult    Referred by Dr. Donald [name]. Pt c/o DOE x 3 months. He states he was dxed with pleural effusion.   Dyspnea:  Baseline able to do 2 flights of steps and still can just with 2/10 pain now because he has to breath deep to get up the steps  Cough: none  Sleep: on side bed flat now though last month had to sit up due to pain and sob  SABA use: none   No obvious day to day or daytime variability or assoc excess/ purulent sputum or mucus plugs or hemoptysis or  subjective wheeze or overt sinus or hb symptoms.   Sleeping as above  without nocturnal  or early am exacerbation  of respiratory  c/o's or need for noct saba. Also denies any obvious fluctuation of symptoms with weather or environmental changes or other aggravating or alleviating factors except as outlined above   No unusual exposure hx or h/o childhood pna/ asthma or knowledge of premature birth.  Current Allergies, Complete Past Medical History, Past Surgical History, Family History, and Social History were reviewed in [employer] record.  ROS  The following are not active complaints unless bolded Hoarseness, sore throat,  dysphagia, dental problems, itching, sneezing,  nasal congestion or discharge of excess mucus or purulent secretions, ear ache,   fever, chills, sweats, unintended wt loss or wt gain, classically  exertional cp,  orthopnea pnd or arm/hand swelling  or leg swelling, presyncope, palpitations, abdominal pain, anorexia, nausea, vomiting, diarrhea  or change in bowel habits or change in bladder habits, change in stools or change in urine, dysuria, hematuria,  rash, arthralgias, visual complaints, headache, numbness, weakness or ataxia or problems with walking or coordination,  change in mood or  memory.              Past Medical History:  Diagnosis Date  . Aortic stenosis    a. Echo 12/15 - EF 55-60%, normal WM, Gr 1 DD, mild AS, mild AI, mean AV 18 mmHg // Echo 03/12/2018: mild LVH, EF 65-70, no RWMA, Gr 1 DD, bicuspid AV, mild AS (mean 17, peak 28), mild AI   . Arthritis    LEFT KNEE  . Chronic combined systolic and diastolic CHF (congestive heart failure) ([place])   . H/O hiatal hernia   . History of cardiac catheterization    LHC 12/12:Patent but Ectatic LAD and RCA (? 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  . Hyperlipidemia   . Hypertension   . Hypogonadism male   .  Kidney stone    nephrolithiasis  . Nonischemic cardiomyopathy ([place])   . Second degree AV block    Resolved off of beta blockers  . Sleep apnea     Outpatient Medications Prior to Visit  Medication Sig Dispense Refill  . acetaminophen (TYLENOL) 650 MG CR tablet Take 650 mg by mouth every 8 (eight) hours as needed for pain.    Marland Kitchen anastrozole (ARIMIDEX) 1 MG tablet Take 1 mg by mouth daily.   9  . aspirin 81 MG tablet Take 81 mg by mouth daily.    . Glucosamine-Chondroit-Vit C-Mn (GLUCOSAMINE CHONDR 500 COMPLEX PO) Take 2 capsules by mouth daily.    Marland Kitchen HYDROcodone-acetaminophen (NORCO/VICODIN) 5-325 MG tablet Take 2 tablets by mouth every 6 (six) hours as needed. 16 tablet 0  . magnesium oxide (MAG-OX) 400 (241.3 MG) MG  tablet Take 400 mg by mouth daily as needed (FOR CRAMPS).     . Multiple Vitamin (MULTIVITAMIN WITH MINERALS) TABS tablet Take 1 tablet by mouth daily.    . naphazoline-pheniramine (NAPHCON-A) 0.025-0.3 % ophthalmic solution Place 1 drop into both eyes as needed. RED EYES    . spironolactone (ALDACTONE) 25 MG tablet Take 1 tablet (25 mg total) by mouth daily. 90 tablet 3  . tamsulosin (FLOMAX) 0.4 MG CAPS capsule Take 0.4 mg by mouth daily.     Marland Kitchen testosterone cypionate (DEPOTESTOTERONE CYPIONATE) 200 MG/ML injection Inject into the muscle every 14 (fourteen) days.     . valsartan-hydrochlorothiazide (DIOVAN-HCT) 320-25 MG tablet Take 1 tablet by mouth daily. 90 tablet 3  . vardenafil (LEVITRA) 20 MG tablet Take 20 mg by mouth daily as needed for erectile dysfunction. ED        Objective:     BP 112/80 (BP Location: Left Arm, Cuff Size: Large)   Pulse 74   Ht 6' 3" (1.905 m)   Wt (!) 339 lb 9.6 oz (154 kg)   SpO2 95%   BMI 42.45 kg/m   SpO2: 95 %   RA  Very pleasant amb bm nad    HEENT: nl dentition, turbinates bilaterally, and oropharynx. Nl external ear canals without cough reflex   NECK :  without JVD/Nodes/TM/ nl carotid upstrokes bilaterally   LUNGS: no acc muscle use,  Nl contour chest which is clear to A and P bilaterally without cough on insp or exp maneuvers   CV:  RRR  no s3 or  II/VI SEM  No  increase in P2, and no edema   ABD:  Obese soft and nontender with nl inspiratory excursion in the supine position. No bruits or organomegaly appreciated, bowel sounds nl  MS:  Nl gait/ ext warm without deformities, calf tenderness, cyanosis or clubbing No obvious joint restrictions   SKIN: warm and dry without lesions    NEURO:  alert, approp, nl sensorium with  no motor or cerebellar deficits apparent.   I personally reviewed images and agree with radiology impression as follows:  CXR:   05/14/18  Plain film is suggestive of left basilar pneumonia and  parapneumonic effusion. Correlation with lab values may be useful. Surgical changes of lap band   Labs ordered/ reviewed:      Chemistry      Component Value Date/Time   NA 139 06/20/2018 1705   NA 140 01/01/2017 1008   K 4.6 06/20/2018 1705   CL 101 06/20/2018 1705   CO2 29 06/20/2018 1705   BUN 17 06/20/2018 1705   BUN 17 01/01/2017 1008  CREATININE 1.28 06/20/2018 1705   CREATININE 1.41 (H) 08/04/2015 1200      Component Value Date/Time   CALCIUM 9.8 06/20/2018 1705   ALKPHOS 55 06/20/2018 1705   AST 29 06/20/2018 1705   ALT 37 06/20/2018 1705   BILITOT 0.4 06/20/2018 1705     Alb                              4.3                06/20/2018       Lab Results  Component Value Date   WBC 5.0 06/20/2018   HGB 14.6 06/20/2018   HCT 44.7 06/20/2018   MCV 84.0 06/20/2018   PLT 333.0 06/20/2018       EOS                                                               0.4                                    06/20/2018            Lab Results  Component Value Date   ESRSEDRATE 31 (H) 06/20/2018          Assessment     Pleural effusion on left Acute onset of L flank/ cp 03/27/18  10/10  Had reduced to 1-2/10 by pulm eval 06/20/2018  - CT abd with mod effusion 04/18/18 - Cxr 05/14/18 mild atx/ small effusion  - ESR 06/20/2018 = 31 with nl wbc   Timing of pain and sob correlating with "a typical episode" for pt of passing a kidney stone are very suggestive of transient obst of the ureter on L which can result in a urinothorax in a pt with a "leaky diaphragm" which we see all the time in pancreatitis and after abd surgery and clincally is much better at this point so does not need further w/u.  He likely has assoc atx but ? Complicated by pna and responded well to abx so  o further abx  and thoracentesis not needed at this point but just f/u in a month with a plain cxr.  Discussed in detail all the  indications, usual  risks and alternatives  relative to the benefits with  patient who agrees to proceed with conservative f/u as outlined    DOE (dyspnea on exertion) He has baseline = MMRC1 = can walk nl pace, flat grade, can't hurry or go uphills or steps s sob = likely due  to obesity and mild Aortic valve dz and is close to baseline despite small  Residual pleural effusion (see separate a/p)   >>> no further w/u needed for now     Morbid obesity due to excess calories (HCC) Body mass index is 42.45 kg/m.     Lab Results  Component Value Date   TSH 2.320 11/27/2011   Contributing to doe for sure >>>   reviewed the need and the process to achieve and maintain neg calorie balance > defer f/u primary care including intermittently monitoring thyroid status  Total time devoted to counseling  > 50 % of initial 60 min office visit:  review case with pt/ discussion of options/alternatives/ personally creating written customized instructions  in presence of pt  then going over those specific  Instructions directly with the pt including how to use all of the meds but in particular covering each new medication in detail and the difference between the maintenance= "automatic" meds and the prns using an action plan format for the latter (If this problem/symptom => do that organization reading Left to right).  Please see AVS from this visit for a full list of these instructions which I personally wrote for this pt and  are unique to this visit.        Christinia Gully, MD 06/20/2018

## 2018-06-21 ENCOUNTER — Encounter: Payer: Self-pay | Admitting: Internal Medicine

## 2018-06-21 DIAGNOSIS — J90 Pleural effusion, not elsewhere classified: Secondary | ICD-10-CM | POA: Insufficient documentation

## 2018-06-21 LAB — CBC WITH DIFFERENTIAL/PLATELET
Basophils Absolute: 0.1 10*3/uL (ref 0.0–0.1)
Basophils Relative: 1.6 % (ref 0.0–3.0)
Eosinophils Absolute: 0.4 10*3/uL (ref 0.0–0.7)
Eosinophils Relative: 7.5 % — ABNORMAL HIGH (ref 0.0–5.0)
HCT: 44.7 % (ref 39.0–52.0)
Hemoglobin: 14.6 g/dL (ref 13.0–17.0)
Lymphocytes Relative: 40.7 % (ref 12.0–46.0)
Lymphs Abs: 2.1 10*3/uL (ref 0.7–4.0)
MCHC: 32.7 g/dL (ref 30.0–36.0)
MCV: 84 fl (ref 78.0–100.0)
Monocytes Absolute: 0.5 10*3/uL (ref 0.1–1.0)
Monocytes Relative: 10 % (ref 3.0–12.0)
NEUTROS PCT: 40.2 % — AB (ref 43.0–77.0)
Neutro Abs: 2 10*3/uL (ref 1.4–7.7)
Platelets: 333 10*3/uL (ref 150.0–400.0)
RBC: 5.32 Mil/uL (ref 4.22–5.81)
RDW: 15.2 % (ref 11.5–15.5)
WBC: 5 10*3/uL (ref 4.0–10.5)

## 2018-06-21 LAB — BASIC METABOLIC PANEL
BUN: 17 mg/dL (ref 6–23)
CO2: 29 mEq/L (ref 19–32)
Calcium: 9.8 mg/dL (ref 8.4–10.5)
Chloride: 101 mEq/L (ref 96–112)
Creatinine, Ser: 1.28 mg/dL (ref 0.40–1.50)
GFR: 76.38 mL/min (ref >60.00)
Glucose, Bld: 103 mg/dL — ABNORMAL HIGH (ref 70–99)
Potassium: 4.6 mEq/L (ref 3.5–5.1)
Sodium: 139 mEq/L (ref 135–145)

## 2018-06-21 LAB — HEPATIC FUNCTION PANEL
ALBUMIN: 4.3 g/dL (ref 3.5–5.2)
ALT: 37 U/L (ref 0–53)
AST: 29 U/L (ref 0–37)
Alkaline Phosphatase: 55 U/L (ref 39–117)
Bilirubin, Direct: 0.1 mg/dL (ref 0.0–0.3)
Total Bilirubin: 0.4 mg/dL (ref 0.2–1.2)
Total Protein: 7.3 g/dL (ref 6.0–8.3)

## 2018-06-21 LAB — SEDIMENTATION RATE: Sed Rate: 31 mm/hr — ABNORMAL HIGH (ref 0–20)

## 2018-06-21 NOTE — Progress Notes (Signed)
Spoke with pt and notified of results per Dr. Wert. Pt verbalized understanding and denied any questions. 

## 2018-06-21 NOTE — Assessment & Plan Note (Addendum)
He has baseline = MMRC1 = can walk nl pace, flat grade, can't hurry or go uphills or steps s sob = likely due  to obesity and mild Aortic valve dz and is close to baseline despite small  Residual pleural effusion (see separate a/p)   >>> no further w/u needed for now

## 2018-06-21 NOTE — Assessment & Plan Note (Addendum)
Acute onset of L flank/ cp 03/27/18  10/10  Had reduced to 1-2/10 by pulm eval 06/20/2018  - CT abd with mod effusion 04/18/18 - Cxr 05/14/18 mild atx/ small effusion  - ESR 06/20/2018 = 31 with nl wbc    Timing of pain and sob correlating with "a typical episode" for pt of passing a kidney stone are very suggestive of transient obst of the ureter on L which can result in a urinothorax in a pt with a "leaky diaphragm" which we see all the time in pancreatitis and after abd surgery and clincally is much better at this point so does not need further w/u.  He likely has assoc atx but ? Complicated by pna and responded well to abx so  o further abx  and thoracentesis not needed at this point but just f/u in a month with a plain cxr.  Discussed in detail all the  indications, usual  risks and alternatives  relative to the benefits with patient who agrees to proceed with conservative f/u as outlined

## 2018-06-23 NOTE — Assessment & Plan Note (Signed)
Body mass index is 42.45 kg/m.    Lab Results  Component Value Date   TSH 2.320 11/27/2011     Contributing to doe for sure >>>   reviewed the need and the process to achieve and maintain neg calorie balance > defer f/u primary care including intermittently monitoring thyroid status       Total time devoted to counseling  > 50 % of initial 60 min office visit:  review case with pt/ discussion of options/alternatives/ personally creating written customized instructions  in presence of pt  then going over those specific  Instructions directly with the pt including how to use all of the meds but in particular covering each new medication in detail and the difference between the maintenance= "automatic" meds and the prns using an action plan format for the latter (If this problem/symptom => do that organization reading Left to right).  Please see AVS from this visit for a full list of these instructions which I personally wrote for this pt and  are unique to this visit.

## 2018-07-17 ENCOUNTER — Ambulatory Visit: Payer: Self-pay | Admitting: Internal Medicine

## 2018-07-19 ENCOUNTER — Ambulatory Visit (INDEPENDENT_AMBULATORY_CARE_PROVIDER_SITE_OTHER)
Admission: RE | Admit: 2018-07-19 | Discharge: 2018-07-19 | Disposition: A | Discharge status: Home / Self Care | Payer: 59 | Source: Ambulatory Visit | Attending: Internal Medicine | Admitting: Internal Medicine

## 2018-07-19 ENCOUNTER — Encounter: Payer: Self-pay | Admitting: Internal Medicine

## 2018-07-19 ENCOUNTER — Ambulatory Visit: Payer: 59 | Admitting: Internal Medicine

## 2018-07-19 VITALS — BP 138/88 | HR 124 | Ht 75.0 in | Wt 332.0 lb

## 2018-07-19 DIAGNOSIS — J90 Pleural effusion, not elsewhere classified: Secondary | ICD-10-CM

## 2018-07-19 NOTE — Progress Notes (Signed)
Joshua [name], male    DOB: 02/18/1966     MRN: [id number]     Brief patient profile:  52 yobm never smoker some rhinitis as child never outgrew but never needed anything more than clariton D in spring time with freq kidney stones since teenage years  With recurrence 03/27/18 severe pain on L flank assoc with very dark urine then sob with breathing >  CT 04/18/18 c/w small effusion  The 1st week in Dec 2019  new  L cp/ sob at still present on cxr 05/14/18 with atx and ? pna> abx x 10 days seemed to help with pain down from a 10 to a 2 but still mild doe and can feel a tightness/pulling on L when tried to take a deep breath so referred to pulmonary clinic 06/20/2018 by Dr   Marcy Siren   Note h/o AS, AI but LA nl size 03/12/18    06/20/2018  Pulmonary/ 1st office eval/Joshua [name]  Chief Complaint  Patient presents with  . Pulmonary Consult    Referred by Dr. Deatra James. Pt c/o DOE x 3 months. He states he was dxed with pleural effusion.   Dyspnea:  Baseline able to do 2 flights of steps and still can just with 2/10 pain now because he has to breath deep to get up the steps  Cough: none  Sleep: on side bed flat now though last month had to sit up due to pain and sob  SABA use: none rec No rx F/u cxr in 4 weeks   07/19/2018  f/u ov/Joshua [name] re: L  Effusion in setting of ? urinothorax vs parapneumonic Chief Complaint  Patient presents with  . Pleural effusion on left    Patient states he feels much better than he did during the last office visit in January.   Dyspnea:  sev flights of steps Cough: none Sleeping: flat / one pillow SABA use: none  02: none  Pain with deep insp down to 1/10    No obvious day to day or daytime variability or assoc excess/ purulent sputum or mucus plugs or hemoptysis or chest tightness, subjective wheeze or overt sinus or hb symptoms.   Sleeping as above  without nocturnal  or early am exacerbation  of respiratory  c/o's or need for noct saba. Also denies any obvious  fluctuation of symptoms with weather or environmental changes or other aggravating or alleviating factors except as outlined above   No unusual exposure hx or h/o childhood pna/ asthma or knowledge of premature birth.  Current Allergies, Complete Past Medical History, Past Surgical History, Family History, and Social History were reviewed in [employer] record.  ROS  The following are not active complaints unless bolded Hoarseness, sore throat, dysphagia, dental problems, itching, sneezing,  nasal congestion or discharge of excess mucus or purulent secretions, ear ache,   fever, chills, sweats, unintended wt loss or wt gain,  exertional cp,  orthopnea pnd or arm/hand swelling  or leg swelling, presyncope, palpitations, abdominal pain, anorexia, nausea, vomiting, diarrhea  or change in bowel habits or change in bladder habits, change in stools or change in urine, dysuria, hematuria,  rash, arthralgias, visual complaints, headache, numbness, weakness or ataxia or problems with walking or coordination,  change in mood or  memory.        Current Meds  Medication Sig  . acetaminophen (TYLENOL) 650 MG CR tablet Take 650 mg by mouth every 8 (eight) hours as needed for pain.  Marland Kitchen  anastrozole (ARIMIDEX) 1 MG tablet Take 1 mg by mouth daily.   Marland Kitchen aspirin 81 MG tablet Take 81 mg by mouth daily.  . Glucosamine-Chondroit-Vit C-Mn (GLUCOSAMINE CHONDR 500 COMPLEX PO) Take 2 capsules by mouth daily.  Marland Kitchen HYDROcodone-acetaminophen (NORCO/VICODIN) 5-325 MG tablet Take 2 tablets by mouth every 6 (six) hours as needed.  . magnesium oxide (MAG-OX) 400 (241.3 MG) MG tablet Take 400 mg by mouth daily as needed (FOR CRAMPS).   . Multiple Vitamin (MULTIVITAMIN WITH MINERALS) TABS tablet Take 1 tablet by mouth daily.  Marland Kitchen spironolactone (ALDACTONE) 25 MG tablet Take 1 tablet (25 mg total) by mouth daily.  . tamsulosin (FLOMAX) 0.4 MG CAPS capsule Take 0.4 mg by mouth daily.   Marland Kitchen testosterone cypionate  (DEPOTESTOTERONE CYPIONATE) 200 MG/ML injection Inject into the muscle every 14 (fourteen) days.   . valsartan-hydrochlorothiazide (DIOVAN-HCT) 320-25 MG tablet Take 1 tablet by mouth daily.  . vardenafil (LEVITRA) 20 MG tablet Take 20 mg by mouth daily as needed for erectile dysfunction. ED                  Objective:     amb pleasant obese bm nad   Wt Readings from Last 3 Encounters:  07/19/18 (!) 332 lb (150.6 kg)  06/20/18 (!) 339 lb 9.6 oz (154 kg)  03/27/18 (!) 320 lb (145.2 kg)     Vital signs reviewed - Note on arrival 02 sats  94% on RA          HEENT: nl dentition, turbinates bilaterally, and oropharynx. Nl external ear canals without cough reflex   NECK :  without JVD/Nodes/TM/ nl carotid upstrokes bilaterally   LUNGS: no acc muscle use,  Nl contour chest which is clear to A and P bilaterally without cough on insp or exp maneuvers   CV:  RRR  no s3   II/VI  Sem s increase in P2, and no edema   ABD:  soft and nontender with nl inspiratory excursion in the supine position. No bruits or organomegaly appreciated, bowel sounds nl  MS:  Nl gait/ ext warm without deformities, calf tenderness, cyanosis or clubbing No obvious joint restrictions   SKIN: warm and dry without lesions    NEURO:  alert, approp, nl sensorium with  no motor or cerebellar deficits apparent.      CXR PA and Lateral:   07/19/2018 :    I personally reviewed images and agree with radiology impression as follows:    Residual left basilar scarring. No visible left effusion. No active disease.      Assessment

## 2018-07-19 NOTE — Patient Instructions (Signed)
Please remember to go to the  x-ray department  for your tests - we will call you with the results when they are available

## 2018-07-20 ENCOUNTER — Encounter: Payer: Self-pay | Admitting: Internal Medicine

## 2018-07-20 NOTE — Assessment & Plan Note (Addendum)
Acute onset of L flank/ cp 03/27/18  >  Severity 10/10  Had reduced to 1-2/10 by pulm eval 06/20/2018  - CT abd with mod effusion 04/18/18 - Cxr 05/14/18 mild atx/ small effusion  - ESR 06/20/2018 = 31 with nl wbc   - cxr 07/19/2018 with min pleural scarring > no f/u needed    At this point not able to say whether this was parapneumonic or urinothorax related to transient ureteral obst from stone but either way no further w/u or f/u needed and pulmonary f/u is prn

## 2018-07-22 NOTE — Progress Notes (Signed)
LMTCB

## 2018-07-23 ENCOUNTER — Telehealth: Payer: Self-pay | Admitting: Internal Medicine

## 2018-07-23 NOTE — Telephone Encounter (Signed)
Notes recorded by Nyoka Cowden, MD on 07/20/2018 at 5:56 AM EST Call pt: Reviewed cxr and improved nicely, minimal residual scar, no f/u needed  Patient aware  Nothing further need at this time.

## 2018-07-23 NOTE — Progress Notes (Signed)
LMTCB

## 2019-03-24 ENCOUNTER — Other Ambulatory Visit: Payer: Self-pay

## 2019-03-24 ENCOUNTER — Telehealth: Payer: Self-pay | Admitting: *Deleted

## 2019-03-24 ENCOUNTER — Telehealth (INDEPENDENT_AMBULATORY_CARE_PROVIDER_SITE_OTHER): Payer: 59 | Admitting: Physician Assistant

## 2019-03-24 ENCOUNTER — Encounter: Payer: Self-pay | Admitting: Physician Assistant

## 2019-03-24 VITALS — BP 110/72 | HR 100 | Ht 75.0 in | Wt 330.0 lb

## 2019-03-24 DIAGNOSIS — Q231 Congenital insufficiency of aortic valve: Secondary | ICD-10-CM

## 2019-03-24 DIAGNOSIS — I10 Essential (primary) hypertension: Secondary | ICD-10-CM

## 2019-03-24 DIAGNOSIS — Q230 Congenital stenosis of aortic valve: Secondary | ICD-10-CM

## 2019-03-24 DIAGNOSIS — I428 Other cardiomyopathies: Secondary | ICD-10-CM

## 2019-03-24 DIAGNOSIS — E119 Type 2 diabetes mellitus without complications: Secondary | ICD-10-CM

## 2019-03-24 NOTE — Telephone Encounter (Signed)

## 2019-03-24 NOTE — Progress Notes (Signed)
Virtual Visit via Video Note   This visit type was conducted due to national recommendations for restrictions regarding the COVID-19 Pandemic (e.g. social distancing) in an effort to limit this patient's exposure and mitigate transmission in our community.  Due to his co-morbid illnesses, this patient is at least at moderate risk for complications without adequate follow up.  This format is felt to be most appropriate for this patient at this time.  All issues noted in this document were discussed and addressed.  A limited physical exam was performed with this format.  Please refer to the patient's chart for his consent to telehealth for [facility].   Date:  03/24/2019   ID:  Joshua [name], DOB 21-Sep-1965, MRN [id number]  Patient Location: Home Provider Location: Office  PCP:  Johny Blamer, MD  Cardiologist:  Lesleigh Noe, MD   Electrophysiologist:  None   Evaluation Performed:  Follow-Up Visit  Chief Complaint:  Bicuspid aortic valve with aortic stenosis  History of Present Illness:    Joshua [name] is a 53 y.o. male with:  Bicuspid aortic valve with aortic stenosis  Echocardiogram 10/19: Mean gradient 17 mmHg  Combined systolic and diastolic CHF  Nonischemic cardiomyopathy  EF 40-45 >> improved to normal by echo in 2015  Second-degree AV block >> resolved off of beta-blocker therapy  Hypertension  Sleep apnea  Diabetes mellitus 2  He was last seen in September 2019.  Follow-up echocardiogram in October 2019 demonstrated mild aortic stenosis with mean gradient 17 mmHg.  He is seen for annual follow up.  He is overall doing well without chest pain, shortness of breath, syncope, orthopnea, leg swelling.  His PCP reduced his Diovan due to low BP.    The patient does not have symptoms concerning for COVID-19 infection (fever, chills, cough, or new shortness of breath).    Past Medical History:  Diagnosis Date  . Aortic stenosis    a. Echo 12/15 - EF  55-60%, normal WM, Gr 1 DD, mild AS, mild AI, mean AV 18 mmHg // Echo 03/12/2018: mild LVH, EF 65-70, no RWMA, Gr 1 DD, bicuspid AV, mild AS (mean 17, peak 28), mild AI   . Arthritis    LEFT KNEE  . Chronic combined systolic and diastolic CHF (congestive heart failure) (HCC)   . H/O hiatal hernia   . History of cardiac catheterization    LHC 12/12:Patent but Ectatic LAD and RCA (? 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  . Hyperlipidemia   . Hypertension   . Hypogonadism male   . Kidney stone    nephrolithiasis  . Nonischemic cardiomyopathy (HCC)   . Second degree AV block    Resolved off of beta blockers  . Sleep apnea    Past Surgical History:  Procedure Laterality Date  . BREATH TEK H PYLORI  12/05/2011   Procedure: BREATH TEK H PYLORI;  Surgeon: Kandis Cocking, MD;  Location: [facility];  Service: General;  Laterality: N/A;  . CARDIAC CATHETERIZATION    . COSMETIC SURGERY     testicle  . HIATAL HERNIA REPAIR  07/09/2012   Procedure: LAPAROSCOPIC REPAIR OF HIATAL HERNIA;  Surgeon: Kandis Cocking, MD;  Location: [facility];  Service: General;;  . LAPAROSCOPIC GASTRIC BANDING  07/09/12  . LEFT HEART CATHETERIZATION WITH CORONARY ANGIOGRAM N/A 06/01/2011   Procedure: LEFT HEART CATHETERIZATION WITH CORONARY ANGIOGRAM;  Surgeon: Lesleigh Noe, MD;  Location[facility]ATH LAB;  Service: Cardiovascular;  Laterality:  N/A;  . MESH APPLIED TO LAP PORT  07/09/2012   Procedure: MESH APPLIED TO LAP PORT;  Surgeon: Shann Medal, MD;  Location: [facility];  Service: General;;     Current Meds  Medication Sig  . anastrozole (ARIMIDEX) 1 MG tablet Take 1 mg by mouth every other day.   Marland Kitchen aspirin 81 MG tablet Take 162 mg by mouth daily.   . Glucosamine-Chondroit-Vit C-Mn (GLUCOSAMINE CHONDR 500 COMPLEX PO) Take 2 capsules by mouth daily.  Marland Kitchen glucose blood (ONETOUCH VERIO) test strip FOR USE WHEN CHECKING BLOOD GLUCOSE ONCE A DAY FINGER STICK 30 DAYS  . JARDIANCE 25 MG TABS tablet Take 25 mg by  mouth daily.  . Lancets (ONETOUCH DELICA PLUS [id number]) MISC USE TO CHECK YOUR BLOOD SUGAR ONCE A DAY 30 DAYS  . magnesium oxide (MAG-OX) 400 (241.3 MG) MG tablet Take 400 mg by mouth daily as needed (FOR CRAMPS).   . Multiple Vitamin (MULTIVITAMIN WITH MINERALS) TABS tablet Take 1 tablet by mouth daily.  . Potassium 99 MG TABS Take 99 mg by mouth daily.  Marland Kitchen spironolactone (ALDACTONE) 25 MG tablet Take 1 tablet (25 mg total) by mouth daily.  . tamsulosin (FLOMAX) 0.4 MG CAPS capsule Take 0.4 mg by mouth daily.   Marland Kitchen testosterone cypionate (DEPOTESTOTERONE CYPIONATE) 200 MG/ML injection Inject into the muscle every 14 (fourteen) days.   . valsartan-hydrochlorothiazide (DIOVAN-HCT) 160-12.5 MG tablet Take 1 tablet by mouth daily.  . vardenafil (LEVITRA) 20 MG tablet Take 20 mg by mouth daily as needed for erectile dysfunction. ED     Allergies:   Bystolic [nebivolol hcl] and Atenolol   Social History   Tobacco Use  . Smoking status: Never Smoker  . Smokeless tobacco: Never Used  Substance Use Topics  . Alcohol use: No  . Drug use: No     Family Hx: The patient's family history includes Cancer in his sister; Heart disease in his father; Hypertension in his brother; Stroke in his mother. There is no history of Heart attack.  ROS:   Please see the history of present illness.    All other systems reviewed and are negative.   Prior CV studies:   The following studies were reviewed today:  Echocardiogram 03/12/2018 Mild LVH, EF 22-02, grade 1 diastolic dysfunction, bicuspid aortic valve with mild aortic stenosis (mean gradient 17 mmHg), mild AI  LHC (12/12):   Patent but Ectatic LAD and RCA (? 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     Echo (12/15):   EF 55-60%, normal WM, Gr 1 DD, mild AS, mild AI, mean AV 18 mmHg   Labs/Other Tests and Data Reviewed:    EKG:  No ECG reviewed.  Recent Labs: 06/20/2018: ALT 37; BUN 17; Creatinine, Ser 1.28; Hemoglobin 14.6; Platelets  333.0; Potassium 4.6; Sodium 139   Recent Lipid Panel Lab Results  Component Value Date/Time   CHOL 145 11/27/2011 09:33 AM   TRIG 66 11/27/2011 09:33 AM   HDL 40 11/27/2011 09:33 AM   CHOLHDL 3.6 11/27/2011 09:33 AM   LDLCALC 92 11/27/2011 09:33 AM    Wt Readings from Last 3 Encounters:  03/24/19 (!) 330 lb (149.7 kg)  07/19/18 (!) 332 lb (150.6 kg)  06/20/18 (!) 339 lb 9.6 oz (154 kg)     Objective:    Vital Signs:  BP 110/72   Pulse 100   Ht 6\' 3"  (1.905 m)   Wt (!) 330 lb (149.7 kg)   BMI 41.25 kg/m  VITAL SIGNS:  reviewed GEN:  no acute distress EYES:  sclerae anicteric, EOMI - Extraocular Movements Intact RESPIRATORY:  normal respiratory effort NEURO:  alert and oriented x 3, no obvious focal deficit PSYCH:  normal affect  ASSESSMENT & PLAN:    1. Aortic stenosis with bicuspid valve Mild aortic stenosis with mean gradient 17 mmHg by echocardiogram in October 2019.  He has not had any symptoms to suggest significant worsening.  We will plan on repeating his echo again in 1 to 2 years.  2. NICM (nonischemic cardiomyopathy) (HCC) EF was previously 40-45.  It has returned to normal.  He is not on beta-blocker secondary to history of bradycardia.  Continue Spironolactone, ARB.  Volume status stable.  3. Essential hypertension The patient's blood pressure is controlled on his current regimen.  Continue current therapy.   4. Type 2 diabetes mellitus without complication, without long-term current use of insulin (HCC) Continue follow-up with primary care.   Time:   Today, I have spent 9 minutes with the patient with telehealth technology discussing the above problems.     Medication Adjustments/Labs and Tests Ordered: Current medicines are reviewed at length with the patient today.  Concerns regarding medicines are outlined above.   Tests Ordered: No orders of the defined types were placed in this encounter.   Medication Changes: No orders of the defined  types were placed in this encounter.   Follow Up:  In Person in 1 year(s)  Signed, Tereso Newcomer, PA-C  03/24/2019 4:27 PM    [facility]

## 2019-03-24 NOTE — Patient Instructions (Signed)
Medication Instructions:   Your physician recommends that you continue on your current medications as directed. Please refer to the Current Medication list given to you today.  If you need a refill on your cardiac medications before your next appointment, please call your pharmacy.   Lab work:  None ordered today  Testing/Procedures:  None ordered today  Follow-Up: At [facility], you and your health needs are our priority.  As part of our continuing mission to provide you with exceptional heart care, we have created designated Provider Care Teams.  These Care Teams include your primary Cardiologist (physician) and Advanced Practice Providers (APPs -  Physician Assistants and Nurse Practitioners) who all work together to provide you with the care you need, when you need it. You will need a follow up appointment in 12 months.  Please call our office 2 months in advance to schedule this appointment.  You may see Sinclair Grooms, MD or one of the following Advanced Practice Providers on your designated Care Team:   Truitt Merle, NP Cecilie Kicks, NP . Kathyrn Drown, NP

## 2019-06-26 DIAGNOSIS — E291 Testicular hypofunction: Secondary | ICD-10-CM | POA: Diagnosis not present

## 2019-07-08 DIAGNOSIS — Z20828 Contact with and (suspected) exposure to other viral communicable diseases: Secondary | ICD-10-CM | POA: Diagnosis not present

## 2019-07-09 DIAGNOSIS — U071 COVID-19: Secondary | ICD-10-CM | POA: Diagnosis not present

## 2019-07-09 DIAGNOSIS — E119 Type 2 diabetes mellitus without complications: Secondary | ICD-10-CM | POA: Diagnosis not present

## 2019-07-09 DIAGNOSIS — I10 Essential (primary) hypertension: Secondary | ICD-10-CM | POA: Diagnosis not present

## 2019-07-11 DIAGNOSIS — E119 Type 2 diabetes mellitus without complications: Secondary | ICD-10-CM | POA: Diagnosis not present

## 2019-07-11 DIAGNOSIS — I10 Essential (primary) hypertension: Secondary | ICD-10-CM | POA: Diagnosis not present

## 2019-07-21 ENCOUNTER — Other Ambulatory Visit: Payer: Self-pay | Admitting: Physician Assistant

## 2019-07-24 DIAGNOSIS — E291 Testicular hypofunction: Secondary | ICD-10-CM | POA: Diagnosis not present

## 2019-08-07 DIAGNOSIS — E291 Testicular hypofunction: Secondary | ICD-10-CM | POA: Diagnosis not present

## 2019-08-21 DIAGNOSIS — E291 Testicular hypofunction: Secondary | ICD-10-CM | POA: Diagnosis not present

## 2019-09-04 DIAGNOSIS — E291 Testicular hypofunction: Secondary | ICD-10-CM | POA: Diagnosis not present

## 2019-09-04 DIAGNOSIS — Z125 Encounter for screening for malignant neoplasm of prostate: Secondary | ICD-10-CM | POA: Diagnosis not present

## 2019-09-18 DIAGNOSIS — E291 Testicular hypofunction: Secondary | ICD-10-CM | POA: Diagnosis not present

## 2019-09-18 DIAGNOSIS — Z125 Encounter for screening for malignant neoplasm of prostate: Secondary | ICD-10-CM | POA: Diagnosis not present

## 2019-09-18 DIAGNOSIS — N5201 Erectile dysfunction due to arterial insufficiency: Secondary | ICD-10-CM | POA: Diagnosis not present

## 2019-10-02 DIAGNOSIS — E291 Testicular hypofunction: Secondary | ICD-10-CM | POA: Diagnosis not present

## 2019-10-16 DIAGNOSIS — E291 Testicular hypofunction: Secondary | ICD-10-CM | POA: Diagnosis not present

## 2019-10-30 DIAGNOSIS — E291 Testicular hypofunction: Secondary | ICD-10-CM | POA: Diagnosis not present

## 2019-11-13 DIAGNOSIS — E291 Testicular hypofunction: Secondary | ICD-10-CM | POA: Diagnosis not present

## 2019-11-27 DIAGNOSIS — E291 Testicular hypofunction: Secondary | ICD-10-CM | POA: Diagnosis not present

## 2019-12-11 DIAGNOSIS — E291 Testicular hypofunction: Secondary | ICD-10-CM | POA: Diagnosis not present

## 2019-12-24 DIAGNOSIS — E291 Testicular hypofunction: Secondary | ICD-10-CM | POA: Diagnosis not present

## 2020-01-07 DIAGNOSIS — E291 Testicular hypofunction: Secondary | ICD-10-CM | POA: Diagnosis not present

## 2020-01-21 DIAGNOSIS — E291 Testicular hypofunction: Secondary | ICD-10-CM | POA: Diagnosis not present

## 2020-02-04 DIAGNOSIS — E291 Testicular hypofunction: Secondary | ICD-10-CM | POA: Diagnosis not present

## 2020-02-18 DIAGNOSIS — E291 Testicular hypofunction: Secondary | ICD-10-CM | POA: Diagnosis not present

## 2020-03-03 DIAGNOSIS — E291 Testicular hypofunction: Secondary | ICD-10-CM | POA: Diagnosis not present

## 2020-03-17 DIAGNOSIS — E291 Testicular hypofunction: Secondary | ICD-10-CM | POA: Diagnosis not present

## 2020-03-23 DIAGNOSIS — E291 Testicular hypofunction: Secondary | ICD-10-CM | POA: Diagnosis not present

## 2020-04-01 DIAGNOSIS — R3915 Urgency of urination: Secondary | ICD-10-CM | POA: Diagnosis not present

## 2020-04-01 DIAGNOSIS — D751 Secondary polycythemia: Secondary | ICD-10-CM | POA: Diagnosis not present

## 2020-04-01 DIAGNOSIS — N401 Enlarged prostate with lower urinary tract symptoms: Secondary | ICD-10-CM | POA: Diagnosis not present

## 2020-04-01 DIAGNOSIS — E291 Testicular hypofunction: Secondary | ICD-10-CM | POA: Diagnosis not present

## 2020-04-14 DIAGNOSIS — E291 Testicular hypofunction: Secondary | ICD-10-CM | POA: Diagnosis not present

## 2020-04-28 DIAGNOSIS — E291 Testicular hypofunction: Secondary | ICD-10-CM | POA: Diagnosis not present

## 2020-05-13 DIAGNOSIS — E291 Testicular hypofunction: Secondary | ICD-10-CM | POA: Diagnosis not present

## 2020-05-14 DIAGNOSIS — Z114 Encounter for screening for human immunodeficiency virus [HIV]: Secondary | ICD-10-CM | POA: Diagnosis not present

## 2020-05-26 DIAGNOSIS — E291 Testicular hypofunction: Secondary | ICD-10-CM | POA: Diagnosis not present

## 2020-06-07 DIAGNOSIS — I10 Essential (primary) hypertension: Secondary | ICD-10-CM | POA: Diagnosis not present

## 2020-06-07 DIAGNOSIS — E119 Type 2 diabetes mellitus without complications: Secondary | ICD-10-CM | POA: Diagnosis not present

## 2020-06-09 DIAGNOSIS — E291 Testicular hypofunction: Secondary | ICD-10-CM | POA: Diagnosis not present

## 2020-06-15 DIAGNOSIS — E119 Type 2 diabetes mellitus without complications: Secondary | ICD-10-CM | POA: Diagnosis not present

## 2020-06-15 DIAGNOSIS — I10 Essential (primary) hypertension: Secondary | ICD-10-CM | POA: Diagnosis not present

## 2020-06-22 ENCOUNTER — Other Ambulatory Visit: Payer: Self-pay | Admitting: Physician Assistant

## 2020-06-23 ENCOUNTER — Other Ambulatory Visit: Payer: Self-pay

## 2020-06-23 DIAGNOSIS — E291 Testicular hypofunction: Secondary | ICD-10-CM | POA: Diagnosis not present

## 2020-06-23 MED ORDER — SPIRONOLACTONE 25 MG PO TABS: 25.0000 mg | ORAL_TABLET | Freq: Every day | ORAL | 0 refills | Status: DC

## 2020-06-30 ENCOUNTER — Ambulatory Visit: Payer: BC Managed Care – PPO | Admitting: Physician Assistant

## 2020-07-07 DIAGNOSIS — E291 Testicular hypofunction: Secondary | ICD-10-CM | POA: Diagnosis not present

## 2020-07-08 ENCOUNTER — Encounter: Payer: Self-pay | Admitting: Physician Assistant

## 2020-07-08 ENCOUNTER — Other Ambulatory Visit: Payer: Self-pay

## 2020-07-08 ENCOUNTER — Ambulatory Visit: Payer: BC Managed Care – PPO | Admitting: Physician Assistant

## 2020-07-08 VITALS — BP 114/80 | HR 96 | Ht 75.0 in | Wt 327.0 lb

## 2020-07-08 DIAGNOSIS — Q231 Congenital insufficiency of aortic valve: Secondary | ICD-10-CM

## 2020-07-08 DIAGNOSIS — I10 Essential (primary) hypertension: Secondary | ICD-10-CM | POA: Diagnosis not present

## 2020-07-08 DIAGNOSIS — I428 Other cardiomyopathies: Secondary | ICD-10-CM | POA: Diagnosis not present

## 2020-07-08 DIAGNOSIS — Q230 Congenital stenosis of aortic valve: Secondary | ICD-10-CM | POA: Diagnosis not present

## 2020-07-08 DIAGNOSIS — G4733 Obstructive sleep apnea (adult) (pediatric): Secondary | ICD-10-CM

## 2020-07-08 NOTE — Patient Instructions (Signed)
Medication Instructions:  Your physician recommends that you continue on your current medications as directed. Please refer to the Current Medication list given to you today.  *If you need a refill on your cardiac medications before your next appointment, please call your pharmacy*   Lab Work: None ordered  If you have labs (blood work) drawn today and your tests are completely normal, you will receive your results only by: . MyChart Message (if you have MyChart) OR . A paper copy in the mail If you have any lab test that is abnormal or we need to change your treatment, we will call you to review the results.   Testing/Procedures: None ordered   Follow-Up: At [facility], you and your health needs are our priority.  As part of our continuing mission to provide you with exceptional heart care, we have created designated Provider Care Teams.  These Care Teams include your primary Cardiologist (physician) and Advanced Practice Providers (APPs -  Physician Assistants and Nurse Practitioners) who all work together to provide you with the care you need, when you need it.  We recommend signing up for the patient portal called "MyChart".  Sign up information is provided on this After Visit Summary.  MyChart is used to connect with patients for Virtual Visits (Telemedicine).  Patients are able to view lab/test results, encounter notes, upcoming appointments, etc.  Non-urgent messages can be sent to your provider as well.   To learn more about what you can do with MyChart, go to [email or web address].    Your next appointment:   12 month(s)  The format for your next appointment:   In Person  Provider:   You may see Henry W Smith III, MD or one of the following Advanced Practice Providers on your designated Care Team:    Jill McDaniel, NP    Other Instructions   

## 2020-07-08 NOTE — Progress Notes (Signed)
Cardiology Office Note:    Date:  07/08/2020   ID:  Joshua [name], DOB Jun 28, 1965, MRN [id number]  PCP:  Johny Blamer, MD  [facility] Cardiologist:  Lesleigh Noe, MD  [facility] Electrophysiologist:  None   Chief Complaint:  15 months follow up  History of Present Illness:    Joshua [name] is a 55 y.o. male with a hx of bicuspid aortic valve with mild stenosis, chronic combined CHF/nonischemic cardiomyopathy with improved LV function, second-degree AV block (resolved off BB) , hypertension, HLD, DM andd OSA(did not tolerated CPA) here for follow up.   Last echocardiogram October 2019 showed LV function of 65 to 70%, grade 1 diastolic dysfunction, mild LVH.  Bicuspid aortic valve with mean gradient of 17 mmHg.  Here today for follow-up.  He works at [employer] near [employer].  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  Denies chest pain, shortness of breath, orthopnea, PND, syncope, lower extremity edema or melena.  Past Medical History:  Diagnosis Date  . Aortic stenosis    a. Echo 12/15 - EF 55-60%, normal WM, Gr 1 DD, mild AS, mild AI, mean AV 18 mmHg // Echo 03/12/2018: mild LVH, EF 65-70, no RWMA, Gr 1 DD, bicuspid AV, mild AS (mean 17, peak 28), mild AI   . Arthritis    LEFT KNEE  . Chronic combined systolic and diastolic CHF (congestive heart failure) (HCC)   . H/O hiatal hernia   . History of cardiac catheterization    LHC 12/12:Patent but Ectatic LAD and RCA (? 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  . Hyperlipidemia   . Hypertension   . Hypogonadism male   . Kidney stone    nephrolithiasis  . Nonischemic cardiomyopathy (HCC)   . Second degree AV block    Resolved off of beta blockers  . Sleep apnea     Past Surgical History:  Procedure Laterality Date  . BREATH TEK H PYLORI  12/05/2011   Procedure: BREATH TEK H PYLORI;  Surgeon: Kandis Cocking, MD;  Location: [facility];  Service: General;   Laterality: N/A;  . CARDIAC CATHETERIZATION    . COSMETIC SURGERY     testicle  . HIATAL HERNIA REPAIR  07/09/2012   Procedure: LAPAROSCOPIC REPAIR OF HIATAL HERNIA;  Surgeon: Kandis Cocking, MD;  Location: [facility];  Service: General;;  . LAPAROSCOPIC GASTRIC BANDING  07/09/12  . LEFT HEART CATHETERIZATION WITH CORONARY ANGIOGRAM N/A 06/01/2011   Procedure: LEFT HEART CATHETERIZATION WITH CORONARY ANGIOGRAM;  Surgeon: Lesleigh Noe, MD;  Location: [facility] CATH LAB;  Service: Cardiovascular;  Laterality: N/A;  . MESH APPLIED TO LAP PORT  07/09/2012   Procedure: MESH APPLIED TO LAP PORT;  Surgeon: Kandis Cocking, MD;  Location: [facility];  Service: General;;    Current Medications: Current Meds  Medication Sig  . anastrozole (ARIMIDEX) 1 MG tablet Take 1 mg by mouth every other day.   Marland Kitchen aspirin 81 MG tablet Take 162 mg by mouth daily.   . Dulaglutide (TRULICITY) 1.5 MG/0.5ML SOPN See admin instructions.  . Glucosamine-Chondroit-Vit C-Mn (GLUCOSAMINE CHONDR 500 COMPLEX PO) Take 2 capsules by mouth daily.  Marland Kitchen glucose blood (ONETOUCH VERIO) test strip FOR USE WHEN CHECKING BLOOD GLUCOSE ONCE A DAY FINGER STICK 30 DAYS  . JARDIANCE 25 MG TABS tablet Take 25 mg by mouth daily.  . Lancets (ONETOUCH DELICA PLUS [id number]) MISC USE TO CHECK YOUR BLOOD SUGAR  ONCE A DAY 30 DAYS  . magnesium oxide (MAG-OX) 400 (241.3 MG) MG tablet Take 400 mg by mouth daily as needed (FOR CRAMPS).   . meloxicam (MOBIC) 7.5 MG tablet Take 7.5 mg by mouth as needed for pain.  . Multiple Vitamin (MULTIVITAMIN WITH MINERALS) TABS tablet Take 1 tablet by mouth daily.  . Potassium 99 MG TABS Take 99 mg by mouth daily.  Marland Kitchen spironolactone (ALDACTONE) 25 MG tablet Take 1 tablet (25 mg total) by mouth daily.  . tamsulosin (FLOMAX) 0.4 MG CAPS capsule Take 0.4 mg by mouth daily.   Marland Kitchen testosterone cypionate (DEPOTESTOTERONE CYPIONATE) 200 MG/ML injection Inject into the muscle every 14 (fourteen) days.  . valsartan-hydrochlorothiazide  (DIOVAN-HCT) 160-12.5 MG tablet Take 1 tablet by mouth daily.  . vardenafil (LEVITRA) 20 MG tablet Take 20 mg by mouth daily as needed for erectile dysfunction. ED     Allergies:   Bystolic [nebivolol hcl] and Atenolol   Social History   Socioeconomic History  . Marital status: Married    Spouse name: Not on file  . Number of children: Not on file  . Years of education: Not on file  . Highest education level: Not on file  Occupational History  . Not on file  Tobacco Use  . Smoking status: Never Smoker  . Smokeless tobacco: Never Used  Vaping Use  . Vaping Use: Never used  Substance and Sexual Activity  . Alcohol use: No  . Drug use: No  . Sexual activity: Yes  Other Topics Concern  . Not on file  Social History Narrative  . Not on file   Social Determinants of Health   Financial Resource Strain: Not on file  Food Insecurity: Not on file  Transportation Needs: Not on file  Physical Activity: Not on file  Stress: Not on file  Social Connections: Not on file     Family History: The patient's family history includes Cancer in his sister; Heart disease in his father; Hypertension in his brother; Stroke in his mother. There is no history of Heart attack.   ROS:   Please see the history of present illness.    All other systems reviewed and are negative.  EKGs/Labs/Other Studies Reviewed:    The following studies were reviewed today:  Echo 03/2018 Study Conclusions   - Left ventricle: The cavity size was normal. Wall thickness was  increased in a pattern of mild LVH. Systolic function was  vigorous. The estimated ejection fraction was in the range of 65%  to 70%. Wall motion was normal; there were no regional wall  motion abnormalities. Doppler parameters are consistent with  abnormal left ventricular relaxation (grade 1 diastolic  dysfunction).  - Aortic valve: Bicuspid; moderately thickened, moderately  calcified leaflets. Valve mobility was  restricted. There was mild  stenosis. There was mild regurgitation. Peak velocity (S): 267  cm/s. Mean gradient (S): 17 mm Hg. Valve area (VTI): 1.3 cm^2.  Valve area (Vmax): 1.49 cm^2. Valve area (Vmean): 1.39 cm^2.   Impressions:   - Compared to the prior study, there has been no significant  interval change.   EKG:  EKG is  ordered today.  The ekg ordered today demonstrates sinus rhythm at rate of 96 bpm  Recent Labs: No results found for requested labs within last 8760 hours.  Recent Lipid Panel    Component Value Date/Time   CHOL 145 11/27/2011 0933   TRIG 66 11/27/2011 0933   HDL 40 11/27/2011 0933   CHOLHDL 3.6  11/27/2011 0933   LDLCALC 92 11/27/2011 0933      Physical Exam:    VS:  BP 114/80   Pulse 96   Ht 6\' 3"  (1.905 m)   Wt (!) 327 lb (148.3 kg)   SpO2 95%   BMI 40.87 kg/m     Wt Readings from Last 3 Encounters:  07/08/20 (!) 327 lb (148.3 kg)  03/24/19 (!) 330 lb (149.7 kg)  07/19/18 (!) 332 lb (150.6 kg)     GEN: Well nourished, well developed in no acute distress HEENT: Normal NECK: No JVD; No carotid bruits LYMPHATICS: No lymphadenopathy .:RRR, soft systolic murmur, rubs, gallops RESPIRATORY:  Clear to auscultation without rales, wheezing or rhonchi  ABDOMEN: Soft, non-tender, non-distended MUSCULOSKELETAL:  No edema; No deformity  SKIN: Warm and dry NEUROLOGIC:  Alert and oriented x 3 PSYCHIATRIC:  Normal affect   ASSESSMENT AND PLAN:    1.  Bicuspid aortic valve with mild aortic stenosis Last echocardiogram in 2019 mean gradient of 17 mmHg.  Follow-up with routine echocardiogram.  No alarming symptoms.  2.  Hypertensive -Blood pressure stable and well-controlled on current medications.  3.  Diabetes mellitus -Followed by PCP.  4.  Sleep apnea -Did not tolerated CPAP  5.  Chronic diastolic heart failure/ischemic cardiomyopathy -Euvolemic.  No CHF symptoms.  Continue current medical therapy.  Not on beta-blocker secondary  to prior history of bradycardia.   Medication Adjustments/Labs and Tests Ordered: Current medicines are reviewed at length with the patient today.  Concerns regarding medicines are outlined above.  Orders Placed This Encounter  Procedures  . EKG 12-Lead   No orders of the defined types were placed in this encounter.   Patient Instructions  Medication Instructions:  Your physician recommends that you continue on your current medications as directed. Please refer to the Current Medication list given to you today.  *If you need a refill on your cardiac medications before your next appointment, please call your pharmacy*   Lab Work: None ordered  If you have labs (blood work) drawn today and your tests are completely normal, you will receive your results only by: 2020 MyChart Message (if you have MyChart) OR . A paper copy in the mail If you have any lab test that is abnormal or we need to change your treatment, we will call you to review the results.   Testing/Procedures: None ordered   Follow-Up: At [facility], you and your health needs are our priority.  As part of our continuing mission to provide you with exceptional heart care, we have created designated Provider Care Teams.  These Care Teams include your primary Cardiologist (physician) and Advanced Practice Providers (APPs -  Physician Assistants and Nurse Practitioners) who all work together to provide you with the care you need, when you need it.  We recommend signing up for the patient portal called "MyChart".  Sign up information is provided on this After Visit Summary.  MyChart is used to connect with patients for Virtual Visits (Telemedicine).  Patients are able to view lab/test results, encounter notes, upcoming appointments, etc.  Non-urgent messages can be sent to your provider as well.   To learn more about what you can do with MyChart, go to [facility].    Your next appointment:   12 month(s)  The  format for your next appointment:   In Person  Provider:   You may see [email or web address], MD or one of the following Advanced Practice Providers on your designated  Care Team:    Georgie Chard, NP    Other Instructions      Signed, Manson Passey, [place]  07/08/2020 3:42 PM    [facility]

## 2020-07-17 ENCOUNTER — Other Ambulatory Visit: Payer: Self-pay | Admitting: Physician Assistant

## 2020-07-21 DIAGNOSIS — E291 Testicular hypofunction: Secondary | ICD-10-CM | POA: Diagnosis not present

## 2020-08-04 DIAGNOSIS — E291 Testicular hypofunction: Secondary | ICD-10-CM | POA: Diagnosis not present

## 2020-08-18 DIAGNOSIS — E291 Testicular hypofunction: Secondary | ICD-10-CM | POA: Diagnosis not present

## 2020-09-01 DIAGNOSIS — E291 Testicular hypofunction: Secondary | ICD-10-CM | POA: Diagnosis not present

## 2020-09-15 DIAGNOSIS — E291 Testicular hypofunction: Secondary | ICD-10-CM | POA: Diagnosis not present

## 2020-09-29 DIAGNOSIS — E291 Testicular hypofunction: Secondary | ICD-10-CM | POA: Diagnosis not present

## 2020-10-13 DIAGNOSIS — E291 Testicular hypofunction: Secondary | ICD-10-CM | POA: Diagnosis not present

## 2020-10-27 DIAGNOSIS — E291 Testicular hypofunction: Secondary | ICD-10-CM | POA: Diagnosis not present

## 2020-11-10 DIAGNOSIS — E291 Testicular hypofunction: Secondary | ICD-10-CM | POA: Diagnosis not present

## 2020-11-12 DIAGNOSIS — J40 Bronchitis, not specified as acute or chronic: Secondary | ICD-10-CM | POA: Diagnosis not present

## 2020-11-24 DIAGNOSIS — E291 Testicular hypofunction: Secondary | ICD-10-CM | POA: Diagnosis not present

## 2020-11-29 DIAGNOSIS — Q231 Congenital insufficiency of aortic valve: Secondary | ICD-10-CM | POA: Diagnosis not present

## 2020-11-29 DIAGNOSIS — I10 Essential (primary) hypertension: Secondary | ICD-10-CM | POA: Diagnosis not present

## 2020-11-29 DIAGNOSIS — E119 Type 2 diabetes mellitus without complications: Secondary | ICD-10-CM | POA: Diagnosis not present

## 2020-12-08 DIAGNOSIS — E291 Testicular hypofunction: Secondary | ICD-10-CM | POA: Diagnosis not present

## 2020-12-22 DIAGNOSIS — E291 Testicular hypofunction: Secondary | ICD-10-CM | POA: Diagnosis not present

## 2020-12-30 DIAGNOSIS — E291 Testicular hypofunction: Secondary | ICD-10-CM | POA: Diagnosis not present

## 2020-12-30 DIAGNOSIS — R3915 Urgency of urination: Secondary | ICD-10-CM | POA: Diagnosis not present

## 2020-12-30 DIAGNOSIS — N401 Enlarged prostate with lower urinary tract symptoms: Secondary | ICD-10-CM | POA: Diagnosis not present

## 2021-01-06 DIAGNOSIS — D751 Secondary polycythemia: Secondary | ICD-10-CM | POA: Diagnosis not present

## 2021-01-06 DIAGNOSIS — Z125 Encounter for screening for malignant neoplasm of prostate: Secondary | ICD-10-CM | POA: Diagnosis not present

## 2021-01-06 DIAGNOSIS — N400 Enlarged prostate without lower urinary tract symptoms: Secondary | ICD-10-CM | POA: Diagnosis not present

## 2021-01-06 DIAGNOSIS — E291 Testicular hypofunction: Secondary | ICD-10-CM | POA: Diagnosis not present

## 2021-01-14 DIAGNOSIS — K5730 Diverticulosis of large intestine without perforation or abscess without bleeding: Secondary | ICD-10-CM | POA: Diagnosis not present

## 2021-01-14 DIAGNOSIS — K635 Polyp of colon: Secondary | ICD-10-CM | POA: Diagnosis not present

## 2021-01-14 DIAGNOSIS — K648 Other hemorrhoids: Secondary | ICD-10-CM | POA: Diagnosis not present

## 2021-01-14 DIAGNOSIS — Z1211 Encounter for screening for malignant neoplasm of colon: Secondary | ICD-10-CM | POA: Diagnosis not present

## 2021-01-19 DIAGNOSIS — E291 Testicular hypofunction: Secondary | ICD-10-CM | POA: Diagnosis not present

## 2021-02-02 DIAGNOSIS — E291 Testicular hypofunction: Secondary | ICD-10-CM | POA: Diagnosis not present

## 2021-02-16 DIAGNOSIS — E291 Testicular hypofunction: Secondary | ICD-10-CM | POA: Diagnosis not present

## 2021-03-02 DIAGNOSIS — E291 Testicular hypofunction: Secondary | ICD-10-CM | POA: Diagnosis not present

## 2021-03-16 DIAGNOSIS — E291 Testicular hypofunction: Secondary | ICD-10-CM | POA: Diagnosis not present

## 2021-03-30 DIAGNOSIS — E291 Testicular hypofunction: Secondary | ICD-10-CM | POA: Diagnosis not present

## 2021-04-13 DIAGNOSIS — E291 Testicular hypofunction: Secondary | ICD-10-CM | POA: Diagnosis not present

## 2021-04-27 DIAGNOSIS — E291 Testicular hypofunction: Secondary | ICD-10-CM | POA: Diagnosis not present

## 2021-05-11 DIAGNOSIS — E291 Testicular hypofunction: Secondary | ICD-10-CM | POA: Diagnosis not present

## 2021-05-25 DIAGNOSIS — E291 Testicular hypofunction: Secondary | ICD-10-CM | POA: Diagnosis not present

## 2021-06-08 DIAGNOSIS — E291 Testicular hypofunction: Secondary | ICD-10-CM | POA: Diagnosis not present

## 2021-06-20 DIAGNOSIS — I5042 Chronic combined systolic (congestive) and diastolic (congestive) heart failure: Secondary | ICD-10-CM | POA: Diagnosis not present

## 2021-06-20 DIAGNOSIS — E119 Type 2 diabetes mellitus without complications: Secondary | ICD-10-CM | POA: Diagnosis not present

## 2021-06-20 DIAGNOSIS — I10 Essential (primary) hypertension: Secondary | ICD-10-CM | POA: Diagnosis not present

## 2021-06-22 DIAGNOSIS — E291 Testicular hypofunction: Secondary | ICD-10-CM | POA: Diagnosis not present

## 2021-07-06 ENCOUNTER — Encounter: Payer: Self-pay | Admitting: Physician Assistant

## 2021-07-06 ENCOUNTER — Other Ambulatory Visit: Payer: Self-pay

## 2021-07-06 ENCOUNTER — Ambulatory Visit: Payer: BC Managed Care – PPO | Admitting: Physician Assistant

## 2021-07-06 VITALS — BP 120/70 | HR 102 | Ht 75.0 in | Wt 329.6 lb

## 2021-07-06 DIAGNOSIS — I428 Other cardiomyopathies: Secondary | ICD-10-CM | POA: Diagnosis not present

## 2021-07-06 DIAGNOSIS — G4733 Obstructive sleep apnea (adult) (pediatric): Secondary | ICD-10-CM | POA: Insufficient documentation

## 2021-07-06 DIAGNOSIS — I441 Atrioventricular block, second degree: Secondary | ICD-10-CM

## 2021-07-06 DIAGNOSIS — Q230 Congenital stenosis of aortic valve: Secondary | ICD-10-CM

## 2021-07-06 DIAGNOSIS — E7800 Pure hypercholesterolemia, unspecified: Secondary | ICD-10-CM | POA: Insufficient documentation

## 2021-07-06 DIAGNOSIS — Q231 Congenital insufficiency of aortic valve: Secondary | ICD-10-CM

## 2021-07-06 DIAGNOSIS — I7789 Other specified disorders of arteries and arterioles: Secondary | ICD-10-CM

## 2021-07-06 DIAGNOSIS — I10 Essential (primary) hypertension: Secondary | ICD-10-CM

## 2021-07-06 DIAGNOSIS — I2510 Atherosclerotic heart disease of native coronary artery without angina pectoris: Secondary | ICD-10-CM | POA: Insufficient documentation

## 2021-07-06 DIAGNOSIS — E291 Testicular hypofunction: Secondary | ICD-10-CM | POA: Diagnosis not present

## 2021-07-06 DIAGNOSIS — E119 Type 2 diabetes mellitus without complications: Secondary | ICD-10-CM | POA: Insufficient documentation

## 2021-07-06 MED ORDER — ROSUVASTATIN CALCIUM 20 MG PO TABS
20.0000 mg | ORAL_TABLET | Freq: Every day | ORAL | 3 refills | Status: DC
Start: 2021-07-06 — End: 2022-07-10

## 2021-07-06 NOTE — Assessment & Plan Note (Signed)
He is not having symptoms to suggest significant worsening.  It has been more than 3 years since his last echocardiogram.  Arrange follow-up echocardiogram.

## 2021-07-06 NOTE — Progress Notes (Signed)
Cardiology Office Note:    Date:  07/06/2021   ID:  Joshua [name], DOB [birth year], MRN [id number]  PCP:  Johny Blamer, MD  [facility] Providers Cardiologist:  Lesleigh Noe, MD Cardiology APP:  Kennon Rounds    Referring MD: Johny Blamer, MD   Chief Complaint:  F/u on AS, CHF    Patient Profile: Specialty Problems       Cardiology Problems   Aortic stenosis with bicuspid valve    Echo 05/30/2011: Mild concentric LVH, EF 40-45, inferoseptal HK, GR 3 DD, bicuspid aortic valve with mild aortic stenosis (mean gradient 18), mild-moderate AI, mild MR Echo (12/15):  EF 55-60%, normal WM, Gr 1 DD, mild AS, mild AI, mean AV 18 mmHg Echo 03/12/2018: Mild LVH, EF 65-70, grade 1 diastolic dysfunction, bicuspid aortic valve with mild aortic stenosis (mean gradient 17 mmHg), mild AI      Hypertension   Second degree AV block, Mobitz type I    Admitted in 2012; Mobitz 1 and 2:1 AV block >> seen by EP Resolved w stopping beta-blocker (Bystolic)      NICM (nonischemic cardiomyopathy) (HCC)    EF returned to normal.  LVEF by echo 40% on 05/31/11 Echocardiogram 12/15: EF 55-60 Echocardiogram 10/19: EF 65-70      Coronary artery ectasia (HCC)    LHC (12/12):  Patent but Ectatic LAD and RCA (? 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        Pure hypercholesterolemia     History of Present Illness:   Joshua [name] is a 56 y.o. male with the above problem list.  He was last seen in 1/22 by Manson Passey, PA-C. He returns for f/u.  He is here alone.  He works at [employer].  He is fairly active and has to go up and down steps all day long.  He has not had chest pain, significant shortness of breath, significant leg edema, syncope.        Past Medical History:  Diagnosis Date   (HFimpEF) heart failure with improved EF 06/02/2011   Non-ischemic cardiomyopathy // EF returned to normal.  LVEF by echo 40% on 05/31/11 Echocardiogram 12/15:  EF 55-60 Echocardiogram 10/19: EF 65-70   Aortic stenosis    a. Echo 12/15 - EF 55-60%, normal WM, Gr 1 DD, mild AS, mild AI, mean AV 18 mmHg // Echo 03/12/2018: mild LVH, EF 65-70, no RWMA, Gr 1 DD, bicuspid AV, mild AS (mean 17, peak 28), mild AI    Arthritis    LEFT KNEE   Coronary artery ectasia (HCC)    LHC (12/12):  Patent but Ectatic LAD and RCA (? 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     DM2 (diabetes mellitus, type 2) (HCC)    H/O hiatal hernia    Hyperlipidemia    Hypertension    Hypogonadism male    Kidney stone    nephrolithiasis   Nonischemic cardiomyopathy (HCC)    Second degree AV block    Resolved off of beta blockers   Sleep apnea    Current Medications: Current Meds  Medication Sig   anastrozole (ARIMIDEX) 1 MG tablet Take 1 mg by mouth every other day.    aspirin 81 MG tablet Take 162 mg by mouth daily.    Bioflavonoid Products (BIOFLEX) TABS Take 2 tablets by mouth daily.   Dulaglutide (TRULICITY) 1.5 MG/0.5ML SOPN Inject 1.5 mg as directed once a week.  Glucosamine-Chondroit-Vit C-Mn (GLUCOSAMINE CHONDR 500 COMPLEX PO) Take 2 capsules by mouth daily.   glucose blood (ONETOUCH VERIO) test strip FOR USE WHEN CHECKING BLOOD GLUCOSE ONCE A DAY FINGER STICK 30 DAYS   JARDIANCE 25 MG TABS tablet Take 25 mg by mouth daily.   Lancets (ONETOUCH DELICA PLUS LANCET30G) MISC USE TO CHECK YOUR BLOOD SUGAR ONCE A DAY 30 DAYS   magnesium oxide (MAG-OX) 400 (241.3 MG) MG tablet Take 400 mg by mouth daily as needed (FOR CRAMPS).    meloxicam (MOBIC) 7.5 MG tablet Take 7.5 mg by mouth as needed for pain.   Multiple Vitamin (MULTIVITAMIN WITH MINERALS) TABS tablet Take 1 tablet by mouth daily.   naproxen sodium (ALEVE) 220 MG tablet Take 220 mg by mouth daily.   Potassium 99 MG TABS Take 99 mg by mouth daily.   rosuvastatin (CRESTOR) 20 MG tablet Take 1 tablet (20 mg total) by mouth daily.   spironolactone (ALDACTONE) 25 MG tablet TAKE 1 TABLET (25 MG TOTAL) BY MOUTH  DAILY.   tamsulosin (FLOMAX) 0.4 MG CAPS capsule Take 0.4 mg by mouth daily.    testosterone cypionate (DEPOTESTOTERONE CYPIONATE) 200 MG/ML injection Inject into the muscle every 14 (fourteen) days.   valsartan-hydrochlorothiazide (DIOVAN-HCT) 160-12.5 MG tablet Take 1 tablet by mouth daily.   vardenafil (LEVITRA) 20 MG tablet Take 20 mg by mouth daily as needed for erectile dysfunction. ED    Allergies:   Bystolic [nebivolol hcl], Atenolol, and Nebivolol hcl   Social History   Tobacco Use   Smoking status: Never   Smokeless tobacco: Never  Vaping Use   Vaping Use: Never used  Substance Use Topics   Alcohol use: No   Drug use: No    Family Hx: The patient's family history includes Cancer in his sister; Heart disease in his father; Hypertension in his brother; Stroke in his mother. There is no history of Heart attack.  Review of Systems  Musculoskeletal:  Positive for joint pain.    EKGs/Labs/Other Test Reviewed:    EKG:  EKG is  ordered today.  The ekg ordered today demonstrates sinus tachycardia, HR 102, normal axis, inferior Q waves, no ST-T wave changes, QTC 411, no change from prior tracing  Recent Labs: No results found for requested labs within last 8760 hours.   Labs obtained through [facility] - personally reviewed and interpreted: 06/20/2021: Total cholesterol 170, HDL 30, LDL 105, triglycerides 195, A1c 7.6, Hgb 17.3, creatinine 1.49, potassium 4.9, ALT 53, TSH 3.49  Recent Lipid Panel No results for input(s): CHOL, TRIG, HDL, VLDL, LDLCALC, LDLDIRECT in the last 8760 hours.   Risk Assessment/Calculations:         Physical Exam:    VS:  BP 120/70 (BP Location: Right Arm)    Pulse (!) 102    Ht 6\' 3"  (1.905 m)    Wt (!) 329 lb 9.6 oz (149.5 kg)    SpO2 95%    BMI 41.20 kg/m     Wt Readings from Last 3 Encounters:  07/06/21 (!) 329 lb 9.6 oz (149.5 kg)  07/08/20 (!) 327 lb (148.3 kg)  03/24/19 (!) 330 lb (149.7 kg)    Constitutional:      Appearance: Healthy  appearance. Not in distress.  Neck:     Vascular: No carotid bruit or JVR. JVD normal.  Pulmonary:     Effort: Pulmonary effort is normal.     Breath sounds: No wheezing. No rales.  Cardiovascular:     Normal  rate. Regular rhythm. Normal S1. Normal S2.      Murmurs: There is a grade 2/6 crescendo-decrescendo systolic murmur at the URSB.  Edema:    Peripheral edema absent.  Abdominal:     Palpations: Abdomen is soft.  Skin:    General: Skin is warm and dry.  Neurological:     General: No focal deficit present.     Mental Status: Alert and oriented to person, place and time.     Cranial Nerves: Cranial nerves are intact.      ASSESSMENT & PLAN:   NICM (nonischemic cardiomyopathy) (HCC) No heart failure symptoms.  He is due for an echocardiogram aortic stenosis.  He cannot take beta-blocker secondary to prior history of second-degree AV block.  Continue valsartan/HCTZ 160/12.5 mg daily, spironolactone 25 mg daily, empagliflozin 25 mg daily.  Aortic stenosis with bicuspid valve He is not having symptoms to suggest significant worsening.  It has been more than 3 years since his last echocardiogram.  Arrange follow-up echocardiogram.  Pure hypercholesterolemia He has a CV risk equivalent with diabetes.  His recent LDL was above 100.  I have recommended starting moderate intensity statin therapy with Rosuvastatin 20 mg once daily.  Obtain fasting CMET, Lipids in 3 mos.    Coronary artery ectasia [facility]) Cardiac catheterization in 2012 demonstrated ectatic LAD and RCA.  He is not having anginal symptoms.  Continue ASA.  Start statin Rx as noted.    Second degree AV block, Mobitz type I He has sinus tachycardia on EKG.  However, he cannot take a beta-blocker due to prior hx of 2nd degree AV block that resolved off of beta-blocker.    Hypertension Blood pressure is well controlled.  Continue spironolactone 25 mg daily, valsartan/HCTZ 160/4.5 mg daily.         Dispo:  Return in about 1  year (around 07/06/2022) for Routine Follow Up w Dr. Katrinka Blazing, or Tereso Newcomer, PA-C.   Medication Adjustments/Labs and Tests Ordered: Current medicines are reviewed at length with the patient today.  Concerns regarding medicines are outlined above.  Tests Ordered: Orders Placed This Encounter  Procedures   Comprehensive metabolic panel   Lipid panel   ECHOCARDIOGRAM COMPLETE   Medication Changes: Meds ordered this encounter  Medications   rosuvastatin (CRESTOR) 20 MG tablet    Sig: Take 1 tablet (20 mg total) by mouth daily.    Dispense:  90 tablet    Refill:  3    Order Specific Question:   Supervising Provider    Answer:   Lewayne Bunting [[id number]]   Signed, Tereso Newcomer, PA-C  07/06/2021 2:43 PM    [facility] [address] [place], [place], [place]  [address] Phone: [phone]; Fax: [phone]

## 2021-07-06 NOTE — Assessment & Plan Note (Signed)
No heart failure symptoms.  He is due for an echocardiogram aortic stenosis.  He cannot take beta-blocker secondary to prior history of second-degree AV block.  Continue valsartan/HCTZ 160/12.5 mg daily, spironolactone 25 mg daily, empagliflozin 25 mg daily.

## 2021-07-06 NOTE — Assessment & Plan Note (Signed)
Cardiac catheterization in 2012 demonstrated ectatic LAD and RCA.  He is not having anginal symptoms.  Continue ASA.  Start statin Rx as noted.

## 2021-07-06 NOTE — Assessment & Plan Note (Signed)
He has a CV risk equivalent with diabetes.  His recent LDL was above 100.  I have recommended starting moderate intensity statin therapy with Rosuvastatin 20 mg once daily.  Obtain fasting CMET, Lipids in 3 mos.

## 2021-07-06 NOTE — Assessment & Plan Note (Signed)
He has sinus tachycardia on EKG.  However, he cannot take a beta-blocker due to prior hx of 2nd degree AV block that resolved off of beta-blocker.

## 2021-07-06 NOTE — Patient Instructions (Addendum)
Medication Instructions:  Start Rosuvastatin (Crestor) 20 mg once daily  *If you need a refill on your cardiac medications before your next appointment, please call your pharmacy*   Lab Work: In 3 mos - Fasting CMET, Lipids. You can come in on the day of your appointment anytime between 7:30-4:30 fasting from midnight the night before.    If you have labs (blood work) drawn today and your tests are completely normal, you will receive your results only by: MyChart Message (if you have MyChart) OR A paper copy in the mail If you have any lab test that is abnormal or we need to change your treatment, we will call you to review the results.   Testing/Procedures: Schedule an Echocardiogram   Your physician has requested that you have an echocardiogram. Echocardiography is a painless test that uses sound waves to create images of your heart. It provides your doctor with information about the size and shape of your heart and how well your hearts chambers and valves are working. This procedure takes approximately one hour. There are no restrictions for this procedure.    Follow-Up: At [facility], you and your health needs are our priority.  As part of our continuing mission to provide you with exceptional heart care, we have created designated Provider Care Teams.  These Care Teams include your primary Cardiologist (physician) and Advanced Practice Providers (APPs -  Physician Assistants and Nurse Practitioners) who all work together to provide you with the care you need, when you need it.  We recommend signing up for the patient portal called "MyChart".  Sign up information is provided on this After Visit Summary.  MyChart is used to connect with patients for Virtual Visits (Telemedicine).  Patients are able to view lab/test results, encounter notes, upcoming appointments, etc.  Non-urgent messages can be sent to your provider as well.   To learn more about what you can do with MyChart, go to  [email or web address].    Your next appointment:   1 year(s)  The format for your next appointment:   In Person  Provider:   Lesleigh Noe, MD  or Tereso Newcomer, PA-C         Other Instructions

## 2021-07-06 NOTE — Assessment & Plan Note (Signed)
Blood pressure is well controlled.  Continue spironolactone 25 mg daily, valsartan/HCTZ 160/4.5 mg daily.

## 2021-07-08 NOTE — Addendum Note (Signed)
Addended by: Kerrie Buffalo on: 07/08/2021 10:36 AM   Modules accepted: Orders

## 2021-07-20 DIAGNOSIS — E291 Testicular hypofunction: Secondary | ICD-10-CM | POA: Diagnosis not present

## 2021-07-22 ENCOUNTER — Other Ambulatory Visit: Payer: Self-pay | Admitting: Physician Assistant

## 2021-07-28 ENCOUNTER — Ambulatory Visit (HOSPITAL_COMMUNITY): Payer: BC Managed Care – PPO | Attending: Internal Medicine

## 2021-07-28 ENCOUNTER — Other Ambulatory Visit: Payer: Self-pay

## 2021-07-28 ENCOUNTER — Encounter: Payer: Self-pay | Admitting: Physician Assistant

## 2021-07-28 DIAGNOSIS — I77810 Thoracic aortic ectasia: Secondary | ICD-10-CM | POA: Insufficient documentation

## 2021-07-28 DIAGNOSIS — I428 Other cardiomyopathies: Secondary | ICD-10-CM | POA: Diagnosis not present

## 2021-07-28 DIAGNOSIS — Q231 Congenital insufficiency of aortic valve: Secondary | ICD-10-CM | POA: Diagnosis not present

## 2021-07-28 DIAGNOSIS — Q230 Congenital stenosis of aortic valve: Secondary | ICD-10-CM | POA: Diagnosis not present

## 2021-07-28 DIAGNOSIS — I712 Thoracic aortic aneurysm, without rupture, unspecified: Secondary | ICD-10-CM

## 2021-07-28 HISTORY — DX: Thoracic aortic ectasia: I77.810

## 2021-07-28 HISTORY — DX: Thoracic aortic aneurysm, without rupture, unspecified: I71.20

## 2021-07-28 LAB — ECHOCARDIOGRAM COMPLETE
AR max vel: 1.4 cm2
AV Area VTI: 1.61 cm2
AV Area mean vel: 1.27 cm2
AV Mean grad: 13 mmHg
AV Peak grad: 23.4 mmHg
Ao pk vel: 2.42 m/s
Area-P 1/2: 3.3 cm2
P 1/2 time: 1197 msec
S' Lateral: 3.2 cm

## 2021-07-29 ENCOUNTER — Other Ambulatory Visit: Payer: Self-pay | Admitting: *Deleted

## 2021-07-29 DIAGNOSIS — I77810 Thoracic aortic ectasia: Secondary | ICD-10-CM

## 2021-08-03 DIAGNOSIS — E291 Testicular hypofunction: Secondary | ICD-10-CM | POA: Diagnosis not present

## 2021-08-10 DIAGNOSIS — E291 Testicular hypofunction: Secondary | ICD-10-CM | POA: Diagnosis not present

## 2021-08-11 ENCOUNTER — Other Ambulatory Visit: Payer: BC Managed Care – PPO

## 2021-08-11 ENCOUNTER — Other Ambulatory Visit: Payer: Self-pay

## 2021-08-11 DIAGNOSIS — I77810 Thoracic aortic ectasia: Secondary | ICD-10-CM

## 2021-08-12 LAB — BASIC METABOLIC PANEL
BUN/Creatinine Ratio: 14 (ref 9–20)
BUN: 21 mg/dL (ref 6–24)
CO2: 22 mmol/L (ref 20–29)
Calcium: 9.7 mg/dL (ref 8.7–10.2)
Chloride: 103 mmol/L (ref 96–106)
Creatinine, Ser: 1.45 mg/dL — ABNORMAL HIGH (ref 0.76–1.27)
Glucose: 156 mg/dL — ABNORMAL HIGH (ref 70–99)
Potassium: 4.4 mmol/L (ref 3.5–5.2)
Sodium: 142 mmol/L (ref 134–144)
eGFR: 58 mL/min/{1.73_m2} — ABNORMAL LOW (ref >59)

## 2021-08-15 ENCOUNTER — Telehealth: Payer: Self-pay | Admitting: *Deleted

## 2021-08-15 ENCOUNTER — Other Ambulatory Visit: Payer: BC Managed Care – PPO

## 2021-08-15 DIAGNOSIS — Z79899 Other long term (current) drug therapy: Secondary | ICD-10-CM

## 2021-08-15 NOTE — Telephone Encounter (Signed)
-----   Message from Beatrice Lecher, PA-C sent at 08/12/2021  8:46 AM EST ----- ?Creatinine slightly increased but overall stable.  ?PLAN ?Continue current medications.  ?Repeat, BMET 1 week  ?Tereso Newcomer, PA-C ?08:45 08/12/21 ?

## 2021-08-16 ENCOUNTER — Ambulatory Visit (INDEPENDENT_AMBULATORY_CARE_PROVIDER_SITE_OTHER)
Admission: RE | Admit: 2021-08-16 | Discharge: 2021-08-16 | Disposition: A | Discharge status: Home / Self Care | Payer: BC Managed Care – PPO | Source: Ambulatory Visit | Attending: Physician Assistant | Admitting: Physician Assistant

## 2021-08-16 ENCOUNTER — Other Ambulatory Visit: Payer: Self-pay

## 2021-08-16 DIAGNOSIS — I7121 Aneurysm of the ascending aorta, without rupture: Secondary | ICD-10-CM | POA: Diagnosis not present

## 2021-08-16 DIAGNOSIS — I77810 Thoracic aortic ectasia: Secondary | ICD-10-CM

## 2021-08-16 DIAGNOSIS — K449 Diaphragmatic hernia without obstruction or gangrene: Secondary | ICD-10-CM | POA: Diagnosis not present

## 2021-08-16 MED ORDER — IOHEXOL 350 MG/ML SOLN
100.0000 mL | Freq: Once | INTRAVENOUS | Status: AC | PRN
  Administered 2021-08-16: 100 mL via INTRAVENOUS

## 2021-08-17 ENCOUNTER — Other Ambulatory Visit: Payer: BC Managed Care – PPO

## 2021-08-17 ENCOUNTER — Encounter: Payer: Self-pay | Admitting: Physician Assistant

## 2021-08-17 DIAGNOSIS — E291 Testicular hypofunction: Secondary | ICD-10-CM | POA: Diagnosis not present

## 2021-08-22 ENCOUNTER — Other Ambulatory Visit: Payer: Self-pay

## 2021-08-22 ENCOUNTER — Other Ambulatory Visit: Payer: BC Managed Care – PPO

## 2021-08-22 DIAGNOSIS — Z79899 Other long term (current) drug therapy: Secondary | ICD-10-CM | POA: Diagnosis not present

## 2021-08-23 LAB — BASIC METABOLIC PANEL
BUN/Creatinine Ratio: 17 (ref 9–20)
BUN: 21 mg/dL (ref 6–24)
CO2: 19 mmol/L — ABNORMAL LOW (ref 20–29)
Calcium: 9.5 mg/dL (ref 8.7–10.2)
Chloride: 103 mmol/L (ref 96–106)
Creatinine, Ser: 1.27 mg/dL (ref 0.76–1.27)
Glucose: 138 mg/dL — ABNORMAL HIGH (ref 70–99)
Potassium: 4.8 mmol/L (ref 3.5–5.2)
Sodium: 142 mmol/L (ref 134–144)
eGFR: 68 mL/min/{1.73_m2} (ref >59)

## 2021-08-31 DIAGNOSIS — E291 Testicular hypofunction: Secondary | ICD-10-CM | POA: Diagnosis not present

## 2021-09-05 DIAGNOSIS — E291 Testicular hypofunction: Secondary | ICD-10-CM | POA: Diagnosis not present

## 2021-09-14 DIAGNOSIS — E291 Testicular hypofunction: Secondary | ICD-10-CM | POA: Diagnosis not present

## 2021-09-28 DIAGNOSIS — E291 Testicular hypofunction: Secondary | ICD-10-CM | POA: Diagnosis not present

## 2021-09-29 ENCOUNTER — Other Ambulatory Visit: Payer: BC Managed Care – PPO

## 2021-09-29 DIAGNOSIS — I7789 Other specified disorders of arteries and arterioles: Secondary | ICD-10-CM

## 2021-09-29 DIAGNOSIS — E7800 Pure hypercholesterolemia, unspecified: Secondary | ICD-10-CM | POA: Diagnosis not present

## 2021-09-29 LAB — COMPREHENSIVE METABOLIC PANEL
ALT: 46 IU/L — ABNORMAL HIGH (ref 0–44)
AST: 36 IU/L (ref 0–40)
Albumin/Globulin Ratio: 1.8 (ref 1.2–2.2)
Albumin: 4.6 g/dL (ref 3.8–4.9)
Alkaline Phosphatase: 68 IU/L (ref 44–121)
BUN/Creatinine Ratio: 13 (ref 9–20)
BUN: 18 mg/dL (ref 6–24)
Bilirubin Total: 0.5 mg/dL (ref 0.0–1.2)
CO2: 25 mmol/L (ref 20–29)
Calcium: 9.7 mg/dL (ref 8.7–10.2)
Chloride: 100 mmol/L (ref 96–106)
Creatinine, Ser: 1.36 mg/dL — ABNORMAL HIGH (ref 0.76–1.27)
Globulin, Total: 2.5 g/dL (ref 1.5–4.5)
Glucose: 116 mg/dL — ABNORMAL HIGH (ref 70–99)
Potassium: 5 mmol/L (ref 3.5–5.2)
Sodium: 139 mmol/L (ref 134–144)
Total Protein: 7.1 g/dL (ref 6.0–8.5)
eGFR: 62 mL/min/{1.73_m2} (ref >59)

## 2021-09-29 LAB — LIPID PANEL
Chol/HDL Ratio: 3.1 ratio (ref 0.0–5.0)
Cholesterol, Total: 119 mg/dL (ref 100–199)
HDL: 39 mg/dL — ABNORMAL LOW (ref >39)
LDL Chol Calc (NIH): 64 mg/dL (ref 0–99)
Triglycerides: 81 mg/dL (ref 0–149)
VLDL Cholesterol Cal: 16 mg/dL (ref 5–40)

## 2021-10-12 DIAGNOSIS — E291 Testicular hypofunction: Secondary | ICD-10-CM | POA: Diagnosis not present

## 2021-10-18 DIAGNOSIS — E291 Testicular hypofunction: Secondary | ICD-10-CM | POA: Diagnosis not present

## 2021-10-26 DIAGNOSIS — E291 Testicular hypofunction: Secondary | ICD-10-CM | POA: Diagnosis not present

## 2021-11-09 DIAGNOSIS — E291 Testicular hypofunction: Secondary | ICD-10-CM | POA: Diagnosis not present

## 2021-11-23 DIAGNOSIS — E291 Testicular hypofunction: Secondary | ICD-10-CM | POA: Diagnosis not present

## 2021-11-28 DIAGNOSIS — E291 Testicular hypofunction: Secondary | ICD-10-CM | POA: Diagnosis not present

## 2021-12-07 DIAGNOSIS — E291 Testicular hypofunction: Secondary | ICD-10-CM | POA: Diagnosis not present

## 2021-12-20 DIAGNOSIS — E119 Type 2 diabetes mellitus without complications: Secondary | ICD-10-CM | POA: Diagnosis not present

## 2021-12-20 DIAGNOSIS — Z23 Encounter for immunization: Secondary | ICD-10-CM | POA: Diagnosis not present

## 2021-12-20 DIAGNOSIS — Z0000 Encounter for general adult medical examination without abnormal findings: Secondary | ICD-10-CM | POA: Diagnosis not present

## 2021-12-20 DIAGNOSIS — I10 Essential (primary) hypertension: Secondary | ICD-10-CM | POA: Diagnosis not present

## 2021-12-21 DIAGNOSIS — E291 Testicular hypofunction: Secondary | ICD-10-CM | POA: Diagnosis not present

## 2022-01-04 DIAGNOSIS — E291 Testicular hypofunction: Secondary | ICD-10-CM | POA: Diagnosis not present

## 2022-01-18 DIAGNOSIS — E291 Testicular hypofunction: Secondary | ICD-10-CM | POA: Diagnosis not present

## 2022-02-01 DIAGNOSIS — E291 Testicular hypofunction: Secondary | ICD-10-CM | POA: Diagnosis not present

## 2022-02-15 DIAGNOSIS — E291 Testicular hypofunction: Secondary | ICD-10-CM | POA: Diagnosis not present

## 2022-03-01 DIAGNOSIS — E291 Testicular hypofunction: Secondary | ICD-10-CM | POA: Diagnosis not present

## 2022-03-01 DIAGNOSIS — Z125 Encounter for screening for malignant neoplasm of prostate: Secondary | ICD-10-CM | POA: Diagnosis not present

## 2022-03-15 DIAGNOSIS — E291 Testicular hypofunction: Secondary | ICD-10-CM | POA: Diagnosis not present

## 2022-03-29 DIAGNOSIS — E291 Testicular hypofunction: Secondary | ICD-10-CM | POA: Diagnosis not present

## 2022-04-12 DIAGNOSIS — E291 Testicular hypofunction: Secondary | ICD-10-CM | POA: Diagnosis not present

## 2022-04-26 DIAGNOSIS — E291 Testicular hypofunction: Secondary | ICD-10-CM | POA: Diagnosis not present

## 2022-05-10 DIAGNOSIS — E291 Testicular hypofunction: Secondary | ICD-10-CM | POA: Diagnosis not present

## 2022-05-24 DIAGNOSIS — E291 Testicular hypofunction: Secondary | ICD-10-CM | POA: Diagnosis not present

## 2022-06-07 DIAGNOSIS — E291 Testicular hypofunction: Secondary | ICD-10-CM | POA: Diagnosis not present

## 2022-06-13 DIAGNOSIS — E119 Type 2 diabetes mellitus without complications: Secondary | ICD-10-CM | POA: Diagnosis not present

## 2022-06-21 DIAGNOSIS — E291 Testicular hypofunction: Secondary | ICD-10-CM | POA: Diagnosis not present

## 2022-06-22 DIAGNOSIS — I10 Essential (primary) hypertension: Secondary | ICD-10-CM | POA: Diagnosis not present

## 2022-06-22 DIAGNOSIS — E119 Type 2 diabetes mellitus without complications: Secondary | ICD-10-CM | POA: Diagnosis not present

## 2022-06-22 DIAGNOSIS — Q231 Congenital insufficiency of aortic valve: Secondary | ICD-10-CM | POA: Diagnosis not present

## 2022-06-22 DIAGNOSIS — E291 Testicular hypofunction: Secondary | ICD-10-CM | POA: Diagnosis not present

## 2022-07-05 DIAGNOSIS — E291 Testicular hypofunction: Secondary | ICD-10-CM | POA: Diagnosis not present

## 2022-07-10 ENCOUNTER — Other Ambulatory Visit: Payer: Self-pay | Admitting: Physician Assistant

## 2022-07-17 ENCOUNTER — Other Ambulatory Visit: Payer: Self-pay | Admitting: *Deleted

## 2022-07-17 MED ORDER — SPIRONOLACTONE 25 MG PO TABS: 25.0000 mg | ORAL_TABLET | Freq: Every day | ORAL | 0 refills | Status: DC

## 2022-07-19 DIAGNOSIS — E291 Testicular hypofunction: Secondary | ICD-10-CM | POA: Diagnosis not present

## 2022-08-02 DIAGNOSIS — E291 Testicular hypofunction: Secondary | ICD-10-CM | POA: Diagnosis not present

## 2022-08-08 ENCOUNTER — Other Ambulatory Visit: Payer: Self-pay | Admitting: Physician Assistant

## 2022-08-16 DIAGNOSIS — E291 Testicular hypofunction: Secondary | ICD-10-CM | POA: Diagnosis not present

## 2022-08-30 DIAGNOSIS — E291 Testicular hypofunction: Secondary | ICD-10-CM | POA: Diagnosis not present

## 2022-09-13 DIAGNOSIS — E291 Testicular hypofunction: Secondary | ICD-10-CM | POA: Diagnosis not present

## 2022-09-19 ENCOUNTER — Encounter: Payer: Self-pay | Admitting: Physician Assistant

## 2022-09-19 ENCOUNTER — Ambulatory Visit: Payer: BC Managed Care – PPO | Attending: Physician Assistant | Admitting: Physician Assistant

## 2022-09-19 VITALS — BP 126/78 | HR 86 | Ht 75.0 in | Wt 325.2 lb

## 2022-09-19 DIAGNOSIS — I2510 Atherosclerotic heart disease of native coronary artery without angina pectoris: Secondary | ICD-10-CM

## 2022-09-19 DIAGNOSIS — E119 Type 2 diabetes mellitus without complications: Secondary | ICD-10-CM

## 2022-09-19 DIAGNOSIS — I7121 Aneurysm of the ascending aorta, without rupture: Secondary | ICD-10-CM | POA: Diagnosis not present

## 2022-09-19 DIAGNOSIS — E7800 Pure hypercholesterolemia, unspecified: Secondary | ICD-10-CM | POA: Diagnosis not present

## 2022-09-19 DIAGNOSIS — I10 Essential (primary) hypertension: Secondary | ICD-10-CM

## 2022-09-19 DIAGNOSIS — Q230 Congenital stenosis of aortic valve: Secondary | ICD-10-CM | POA: Diagnosis not present

## 2022-09-19 DIAGNOSIS — Q231 Congenital insufficiency of aortic valve: Secondary | ICD-10-CM

## 2022-09-19 DIAGNOSIS — I428 Other cardiomyopathies: Secondary | ICD-10-CM

## 2022-09-19 NOTE — Progress Notes (Signed)
Cardiology Office Note:    Date:  09/19/2022  ID:  Joshua [name], DOB Nov 01, 1965, MRN [id number] [facility] Providers Cardiologist:  Christell Constant, MD Cardiology APP:  Beatrice Lecher, PA-C      Patient Profile:   Aortic stenosis with bicuspid valve TTE 05/30/2011: EF 40-45 mild aortic stenosis (mean gradient 18), mild-moderate AI TTE (12/15):  EF 55-60%, mean AV 18 mmHg TTE 03/12/2018: EF 65-70, mean AV gradient 17 mmHg) mild AI TEE 07/28/2021: EF 60-65, no RWMA, mild LVH, GR 1 DD, normal RVSF, trivial MR, moderate AV calcification, mild to moderate AI, mild aortic stenosis (mean 13, V-max 242 cm/s, DI 0.51), ascending aorta 43 mm, RAP 3 Asc Thoracic Aortic Aneurysm  CT 08/2021: 4 cm  Second degree AV block, Mobitz type I Admitted in 2012; Mobitz 1 and 2:1 AV block >> seen by EP Resolved w stopping beta-blocker (Bystolic) NICM (nonischemic cardiomyopathy) (HCC) EF returned to normal.  LVEF by echo 40% on 05/31/11 TTE 07/2011: EF 60-65 Coronary artery ectasia (HCC) LHC (12/12):  Patent but Ectatic LAD and RCA (? 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   Pure hypercholesterolemia Hypertension  Chronic kidney disease     History of Present Illness:   Joshua [name] is a 57 y.o. male who returns for f/u on aortic valve disease, cardiomyopathy. He was last seen 07/06/21. He is here alone. He is doing well w/o chest pain, shortness of breath, syncope, leg edema.  Review of Systems  Gastrointestinal:  Negative for hematochezia.  Genitourinary:  Negative for hematuria.   See HPI    Studies Reviewed:    EKG:  NSR, HR 86, normal axis, no STTW changes, QTc 399 ms, no change from prior EKGs  Risk Assessment/Calculations:             Physical Exam:   VS:  BP 126/78   Pulse 86   Ht 6\' 3"  (1.905 m)   Wt (!) 325 lb 3.2 oz (147.5 kg)   SpO2 92%   BMI 40.65 kg/m    Wt Readings from Last 3 Encounters:  09/19/22 (!) 325 lb 3.2 oz (147.5 kg)  07/06/21 (!)  329 lb 9.6 oz (149.5 kg)  07/08/20 (!) 327 lb (148.3 kg)    Constitutional:      Appearance: Healthy appearance. Not in distress.  Neck:     Vascular: JVD normal.  Pulmonary:     Breath sounds: Normal breath sounds. No wheezing. No rales.  Cardiovascular:     Normal rate. Regular rhythm. Normal S1. Normal S2.      Murmurs: There is a grade 2/6 crescendo-decrescendo systolic murmur at the URSB.  Edema:    Peripheral edema absent.  Abdominal:     Palpations: Abdomen is soft.       ASSESSMENT AND PLAN:   Aortic stenosis with bicuspid valve Last echocardiogram in 07/2021 demonstrated mild AS and mild to mod AI. He is asymptomatic. Obtain f/u echocardiogram again in 1 year prior to next OV. F/u 1 year. I will have him f/u with me and Dr. Izora Ribas given Dr. Michaelle Copas retirement.   Thoracic aortic aneurysm 4 cm on CT in 08/2021. He is due for f/u. He would like to wait until next year b/c of insurance coverage. Plan gated chest/aorta CTA in 07/2023 prior to next OV.   Pure hypercholesterolemia LDL was optimal at 64 in 09/2021. Continue Crestor 20 mg once daily. He will have annual labs with primary care in June 2024.  Hypertension BP is controlled. Continue Spironolactone 25 mg once daily, Diovan-HCT 160/12.5 mg once daily.  Coronary artery ectasia (HCC) Noted on cardiac catheterization in 2012. He is not having chest pain to suggest angina. Continue ASA 81 mg once daily, Crestor 20 mg once daily.   NICM (nonischemic cardiomyopathy) (HCC) EF was down to 40% in 2012 but returned to normal. EF was normal on echocardiogram in 07/2021. Continue spironolactone 25 mg once daily, Valsartan/HCTZ 160/12.5 mg once daily.   DM2 (diabetes mellitus, type 2) (HCC) Managed by primary care.         Dispo:  Return in about 1 year (around 09/19/2023) for Routine Follow Up, w/ Tereso Newcomer, PA-C or Dr. Izora Ribas.  Signed, Tereso Newcomer, PA-C

## 2022-09-19 NOTE — Assessment & Plan Note (Signed)
BP is controlled. Continue Spironolactone 25 mg once daily, Diovan-HCT 160/12.5 mg once daily.

## 2022-09-19 NOTE — Assessment & Plan Note (Signed)
4 cm on CT in 08/2021. He is due for f/u. He would like to wait until next year b/c of insurance coverage. Plan gated chest/aorta CTA in 07/2023 prior to next OV.

## 2022-09-19 NOTE — Assessment & Plan Note (Signed)
EF was down to 40% in 2012 but returned to normal. EF was normal on echocardiogram in 07/2021. Continue spironolactone 25 mg once daily, Valsartan/HCTZ 160/12.5 mg once daily.

## 2022-09-19 NOTE — Assessment & Plan Note (Signed)
Noted on cardiac catheterization in 2012. He is not having chest pain to suggest angina. Continue ASA 81 mg once daily, Crestor 20 mg once daily.

## 2022-09-19 NOTE — Assessment & Plan Note (Signed)
Managed by primary care. 

## 2022-09-19 NOTE — Assessment & Plan Note (Signed)
LDL was optimal at 64 in 09/2021. Continue Crestor 20 mg once daily. He will have annual labs with primary care in June 2024.

## 2022-09-19 NOTE — Patient Instructions (Signed)
Medication Instructions:  Your physician recommends that you continue on your current medications as directed. Please refer to the Current Medication list given to you today.  *If you need a refill on your cardiac medications before your next appointment, please call your pharmacy*   Lab Work: None ordered  If you have labs (blood work) drawn today and your tests are completely normal, you will receive your results only by: MyChart Message (if you have MyChart) OR A paper copy in the mail If you have any lab test that is abnormal or we need to change your treatment, we will call you to review the results.   Testing/Procedures: Your physician has requested that you have an echocardiogram. Echocardiography is a painless test that uses sound waves to create images of your heart. It provides your doctor with information about the size and shape of your heart and how well your heart's chambers and valves are working. This procedure takes approximately one hour. There are no restrictions for this procedure. Please do NOT wear cologne, perfume, aftershave, or lotions (deodorant is allowed). Please arrive 15 minutes prior to your appointment time.   Non-Cardiac CT Angiography (CTA), is a special type of CT scan that uses a computer to produce multi-dimensional views of major blood vessels throughout the body. In CT angiography, a contrast material is injected through an IV to help visualize the blood vessels    Follow-Up: At [facility], you and your health needs are our priority.  As part of our continuing mission to provide you with exceptional heart care, we have created designated Provider Care Teams.  These Care Teams include your primary Cardiologist (physician) and Advanced Practice Providers (APPs -  Physician Assistants and Nurse Practitioners) who all work together to provide you with the care you need, when you need it.  We recommend signing up for the patient portal called  "MyChart".  Sign up information is provided on this After Visit Summary.  MyChart is used to connect with patients for Virtual Visits (Telemedicine).  Patients are able to view lab/test results, encounter notes, upcoming appointments, etc.  Non-urgent messages can be sent to your provider as well.   To learn more about what you can do with MyChart, go to [email or web address].    Your next appointment:   1 year(s) AFTER ECHO AND CT  Provider:   Tereso Newcomer, PA-C         Other Instructions

## 2022-09-19 NOTE — Assessment & Plan Note (Signed)
Last echocardiogram in 07/2021 demonstrated mild AS and mild to mod AI. He is asymptomatic. Obtain f/u echocardiogram again in 1 year prior to next OV. F/u 1 year. I will have him f/u with me and Dr. Izora Ribas given Dr. Michaelle Copas retirement.

## 2022-09-27 DIAGNOSIS — E291 Testicular hypofunction: Secondary | ICD-10-CM | POA: Diagnosis not present

## 2022-10-11 DIAGNOSIS — E291 Testicular hypofunction: Secondary | ICD-10-CM | POA: Diagnosis not present

## 2022-10-16 ENCOUNTER — Other Ambulatory Visit: Payer: Self-pay | Admitting: Physician Assistant

## 2022-10-25 DIAGNOSIS — E291 Testicular hypofunction: Secondary | ICD-10-CM | POA: Diagnosis not present

## 2022-11-08 DIAGNOSIS — E291 Testicular hypofunction: Secondary | ICD-10-CM | POA: Diagnosis not present

## 2022-11-10 DIAGNOSIS — E291 Testicular hypofunction: Secondary | ICD-10-CM | POA: Diagnosis not present

## 2022-11-13 DIAGNOSIS — N5201 Erectile dysfunction due to arterial insufficiency: Secondary | ICD-10-CM | POA: Diagnosis not present

## 2022-11-13 DIAGNOSIS — N400 Enlarged prostate without lower urinary tract symptoms: Secondary | ICD-10-CM | POA: Diagnosis not present

## 2022-11-13 DIAGNOSIS — E291 Testicular hypofunction: Secondary | ICD-10-CM | POA: Diagnosis not present

## 2022-11-15 ENCOUNTER — Other Ambulatory Visit: Payer: Self-pay | Admitting: Physician Assistant

## 2022-11-23 DIAGNOSIS — E291 Testicular hypofunction: Secondary | ICD-10-CM | POA: Diagnosis not present

## 2022-12-07 DIAGNOSIS — E291 Testicular hypofunction: Secondary | ICD-10-CM | POA: Diagnosis not present

## 2022-12-26 DIAGNOSIS — I10 Essential (primary) hypertension: Secondary | ICD-10-CM | POA: Diagnosis not present

## 2022-12-26 DIAGNOSIS — E119 Type 2 diabetes mellitus without complications: Secondary | ICD-10-CM | POA: Diagnosis not present

## 2022-12-26 DIAGNOSIS — E7800 Pure hypercholesterolemia, unspecified: Secondary | ICD-10-CM | POA: Diagnosis not present

## 2022-12-26 DIAGNOSIS — Z0000 Encounter for general adult medical examination without abnormal findings: Secondary | ICD-10-CM | POA: Diagnosis not present

## 2022-12-27 DIAGNOSIS — E291 Testicular hypofunction: Secondary | ICD-10-CM | POA: Diagnosis not present

## 2023-01-10 DIAGNOSIS — E291 Testicular hypofunction: Secondary | ICD-10-CM | POA: Diagnosis not present

## 2023-01-24 DIAGNOSIS — E291 Testicular hypofunction: Secondary | ICD-10-CM | POA: Diagnosis not present

## 2023-02-07 DIAGNOSIS — E291 Testicular hypofunction: Secondary | ICD-10-CM | POA: Diagnosis not present

## 2023-02-21 DIAGNOSIS — E291 Testicular hypofunction: Secondary | ICD-10-CM | POA: Diagnosis not present

## 2023-03-07 DIAGNOSIS — E291 Testicular hypofunction: Secondary | ICD-10-CM | POA: Diagnosis not present

## 2023-03-21 DIAGNOSIS — E291 Testicular hypofunction: Secondary | ICD-10-CM | POA: Diagnosis not present

## 2023-04-04 DIAGNOSIS — E291 Testicular hypofunction: Secondary | ICD-10-CM | POA: Diagnosis not present

## 2023-04-18 DIAGNOSIS — E291 Testicular hypofunction: Secondary | ICD-10-CM | POA: Diagnosis not present

## 2023-05-02 DIAGNOSIS — E291 Testicular hypofunction: Secondary | ICD-10-CM | POA: Diagnosis not present

## 2023-05-08 DIAGNOSIS — N400 Enlarged prostate without lower urinary tract symptoms: Secondary | ICD-10-CM | POA: Diagnosis not present

## 2023-05-08 DIAGNOSIS — E291 Testicular hypofunction: Secondary | ICD-10-CM | POA: Diagnosis not present

## 2023-05-17 DIAGNOSIS — E291 Testicular hypofunction: Secondary | ICD-10-CM | POA: Diagnosis not present

## 2023-05-31 DIAGNOSIS — E291 Testicular hypofunction: Secondary | ICD-10-CM | POA: Diagnosis not present

## 2023-06-14 DIAGNOSIS — E291 Testicular hypofunction: Secondary | ICD-10-CM | POA: Diagnosis not present

## 2023-06-15 DIAGNOSIS — E119 Type 2 diabetes mellitus without complications: Secondary | ICD-10-CM | POA: Diagnosis not present

## 2023-06-27 DIAGNOSIS — E291 Testicular hypofunction: Secondary | ICD-10-CM | POA: Diagnosis not present

## 2023-06-28 DIAGNOSIS — E291 Testicular hypofunction: Secondary | ICD-10-CM | POA: Diagnosis not present

## 2023-06-28 DIAGNOSIS — E7800 Pure hypercholesterolemia, unspecified: Secondary | ICD-10-CM | POA: Diagnosis not present

## 2023-06-28 DIAGNOSIS — I10 Essential (primary) hypertension: Secondary | ICD-10-CM | POA: Diagnosis not present

## 2023-06-28 DIAGNOSIS — E119 Type 2 diabetes mellitus without complications: Secondary | ICD-10-CM | POA: Diagnosis not present

## 2023-07-11 DIAGNOSIS — E291 Testicular hypofunction: Secondary | ICD-10-CM | POA: Diagnosis not present

## 2023-07-12 ENCOUNTER — Telehealth: Payer: Self-pay | Admitting: Pharmacist

## 2023-07-12 NOTE — Progress Notes (Addendum)
   07/12/2023  Patient ID: [name], male   DOB: [birth year], 58 y.o.   MRN: [id number]  [employer] Scheduling:   Tried calling patient today to schedule free DM initial visit based on A1c reading with me as the pharmacist. Left HIPAA compliant voicemail requesting call back at earliest convenience to schedule. If patient returns call to office, you can transfer her to my line at [phone]. Thanks!  Update from 08/01/23: Tried calling patient again. Left another voicemail requesting call back at earliest convenience to schedule.   Marlowe Aschoff, PharmD [facility] Phone Number: [phone]

## 2023-07-17 ENCOUNTER — Ambulatory Visit (HOSPITAL_COMMUNITY): Payer: BC Managed Care – PPO | Attending: Cardiovascular Disease

## 2023-07-17 DIAGNOSIS — Q230 Congenital stenosis of aortic valve: Secondary | ICD-10-CM | POA: Diagnosis not present

## 2023-07-17 DIAGNOSIS — Q2381 Bicuspid aortic valve: Secondary | ICD-10-CM | POA: Diagnosis not present

## 2023-07-17 DIAGNOSIS — I7121 Aneurysm of the ascending aorta, without rupture: Secondary | ICD-10-CM

## 2023-07-17 LAB — ECHOCARDIOGRAM COMPLETE
AR max vel: 1.67 cm2
AV Area VTI: 1.66 cm2
AV Area mean vel: 1.57 cm2
AV Mean grad: 12 mmHg
AV Peak grad: 22.4 mmHg
Ao pk vel: 2.37 m/s
Area-P 1/2: 2.63 cm2
P 1/2 time: 492 ms
S' Lateral: 3.1 cm

## 2023-07-18 ENCOUNTER — Telehealth: Payer: Self-pay | Admitting: *Deleted

## 2023-07-18 DIAGNOSIS — I77810 Thoracic aortic ectasia: Secondary | ICD-10-CM

## 2023-07-18 NOTE — Telephone Encounter (Signed)
-----   Message from Glendia Ferrier sent at 07/17/2023  5:37 PM EST ----- Results sent to Evalene LITTIE Gavel via MyChart. See MyChart comments below.  I have sent a copy to his PCP as FYI. PLAN:  -Arrange echocardiogram 1 year (Dx: Dilated ascending thoracic aorta)  Mr. Joshua [name]  Your echocardiogram demonstrated normal ejection fraction (heart function).  There is mild leakage of the aortic valve (aortic insufficiency) and mild stiffness of the aortic valve (aortic stenosis).  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  This is stable.  We can continue to monitor your aorta with annual echocardiograms. Glendia Ferrier, PA-C

## 2023-07-25 DIAGNOSIS — E291 Testicular hypofunction: Secondary | ICD-10-CM | POA: Diagnosis not present

## 2023-08-08 DIAGNOSIS — E291 Testicular hypofunction: Secondary | ICD-10-CM | POA: Diagnosis not present

## 2023-08-22 DIAGNOSIS — E291 Testicular hypofunction: Secondary | ICD-10-CM | POA: Diagnosis not present

## 2023-09-05 DIAGNOSIS — E291 Testicular hypofunction: Secondary | ICD-10-CM | POA: Diagnosis not present

## 2023-09-07 ENCOUNTER — Ambulatory Visit (INDEPENDENT_AMBULATORY_CARE_PROVIDER_SITE_OTHER)

## 2023-09-07 ENCOUNTER — Ambulatory Visit: Payer: BC Managed Care – PPO | Attending: Internal Medicine | Admitting: Internal Medicine

## 2023-09-07 VITALS — BP 118/80 | HR 105 | Ht 76.0 in | Wt 305.4 lb

## 2023-09-07 DIAGNOSIS — I7121 Aneurysm of the ascending aorta, without rupture: Secondary | ICD-10-CM

## 2023-09-07 DIAGNOSIS — R000 Tachycardia, unspecified: Secondary | ICD-10-CM

## 2023-09-07 DIAGNOSIS — I459 Conduction disorder, unspecified: Secondary | ICD-10-CM

## 2023-09-07 DIAGNOSIS — I2510 Atherosclerotic heart disease of native coronary artery without angina pectoris: Secondary | ICD-10-CM

## 2023-09-07 DIAGNOSIS — I77810 Thoracic aortic ectasia: Secondary | ICD-10-CM | POA: Diagnosis not present

## 2023-09-07 DIAGNOSIS — E7800 Pure hypercholesterolemia, unspecified: Secondary | ICD-10-CM | POA: Diagnosis not present

## 2023-09-07 DIAGNOSIS — Q2381 Bicuspid aortic valve: Secondary | ICD-10-CM

## 2023-09-07 DIAGNOSIS — Q230 Congenital stenosis of aortic valve: Secondary | ICD-10-CM

## 2023-09-07 NOTE — Patient Instructions (Addendum)
 Medication Instructions:  Your physician recommends that you continue on your current medications as directed. Please refer to the Current Medication list given to you today.  *If you need a refill on your cardiac medications before your next appointment, please call your pharmacy*  Lab Work: NONE  If you have labs (blood work) drawn today and your tests are completely normal, you will receive your results only by: MyChart Message (if you have MyChart) OR A paper copy in the mail If you have any lab test that is abnormal or we need to change your treatment, we will call you to review the results.  Testing/Procedures: FEB 2026- - - Your physician has requested that you have an echocardiogram. Echocardiography is a painless test that uses sound waves to create images of your heart. It provides your doctor with information about the size and shape of your heart and how well your heart's chambers and valves are working. This procedure takes approximately one hour. There are no restrictions for this procedure. Please do NOT wear cologne, perfume, aftershave, or lotions (deodorant is allowed). Please arrive 15 minutes prior to your appointment time.  Please note: We ask at that you not bring children with you during ultrasound (echo/ vascular) testing. Due to room size and safety concerns, children are not allowed in the ultrasound rooms during exams. Our front office staff cannot provide observation of children in our lobby area while testing is being conducted. An adult accompanying a patient to their appointment will only be allowed in the ultrasound room at the discretion of the ultrasound technician under special circumstances. We apologize for any inconvenience.  Your physician has requested that you wear a heart monitor.   Follow-Up: At [facility], you and your health needs are our priority.  As part of our continuing mission to provide you with exceptional heart care, our  providers are all part of one team.  This team includes your primary Cardiologist (physician) and Advanced Practice Providers or APPs (Physician Assistants and Nurse Practitioners) who all work together to provide you with the care you need, when you need it.  Your next appointment:   1 year(s)  Provider:   Tereso Newcomer, PA-C       Other Instructions Joshua [name]- Long Term Monitor Instructions  Your physician has requested you wear a ZIO patch monitor for 14 days.  This is a single patch monitor. [employer] supplies one patch monitor per enrollment. Additional stickers are not available. Please do not apply patch if you will be having a Nuclear Stress Test,   Cardiac CT, MRI, or Chest Xray during the period you would be wearing the  monitor. The patch cannot be worn during these tests. You cannot remove and re-apply the  ZIO XT patch monitor.  Your ZIO patch monitor will be mailed 3 day USPS to your address on file. It may take 3-5 days  to receive your monitor after you have been enrolled.  Once you have received your monitor, please review the enclosed instructions. Your monitor  has already been registered assigning a specific monitor serial # to you.  Billing and Patient Assistance Program Information  We have supplied [employer] with any of your insurance information on file for billing purposes. [employer] offers a sliding scale Patient Assistance Program for patients that do not have  insurance, or whose insurance does not completely cover the cost of the ZIO monitor.  You must apply for the Patient Assistance Program to qualify for this discounted  rate.  To apply, please call [employer] at [phone], select option 4, select option 2, ask to apply for  Patient Assistance Program. Joshua [name] will ask your household income, and how many people  are in your household. They will quote your out-of-pocket cost based on that information.  [employer] will also be able to set up a 69-month, interest-free  payment plan if needed.  Applying the monitor   Shave hair from upper left chest.  Hold abrader disc by orange tab. Rub abrader in 40 strokes over the upper left chest as  indicated in your monitor instructions.  Clean area with 4 enclosed alcohol pads. Let dry.  Apply patch as indicated in monitor instructions. Patch will be placed under collarbone on left  side of chest with arrow pointing upward.  Rub patch adhesive wings for 2 minutes. Remove white label marked "1". Remove the white  label marked "2". Rub patch adhesive wings for 2 additional minutes.  While looking in a mirror, press and release button in center of patch. A small green light will  flash 3-4 times. This will be your only indicator that the monitor has been turned on.  Do not shower for the first 24 hours. You may shower after the first 24 hours.  Press the button if you feel a symptom. You will hear a small click. Record Date, Time and  Symptom in the Patient Logbook.  When you are ready to remove the patch, follow instructions on the last 2 pages of Patient  Logbook. Stick patch monitor onto the last page of Patient Logbook.  Place Patient Logbook in the blue and white box. Use locking tab on box and tape box closed  securely. The blue and white box has prepaid postage on it. Please place it in the mailbox as  soon as possible. Your physician should have your test results approximately 7 days after the  monitor has been mailed back to [facility].  Call [facility] Customer Care at [phone] if you have questions regarding  your ZIO XT patch monitor. Call them immediately if you see an orange light blinking on your  monitor.  If your monitor falls off in less than 4 days, contact our Monitor department at [phone].  If your monitor becomes loose or falls off after 4 days call [employer] at [phone] for  suggestions on securing your monitor        1st Floor: - Lobby - Registration  - Pharmacy   - Lab - Cafe  2nd Floor: - PV Lab - Diagnostic Testing (echo, CT, nuclear med)  3rd Floor: - Vacant  4th Floor: - TCTS (cardiothoracic surgery) - AFib Clinic - Structural Heart Clinic - Vascular Surgery  - Vascular Ultrasound  5th Floor: - HeartCare Cardiology (general and EP) - Clinical Pharmacy for coumadin, hypertension, lipid, weight-loss medications, and med management appointments    Valet parking services will be available as well.

## 2023-09-07 NOTE — Progress Notes (Unsigned)
 Enrolled patient for a 14 day Zio XT  monitor to be mailed to patients home

## 2023-09-07 NOTE — Progress Notes (Signed)
 Cardiology Office Note:  .    Date:  09/07/2023  ID:  Joshua [name], DOB October 08, 1965, MRN [id number] PCP: Noberto Retort, MD  [facility] Providers Cardiologist:  Christell Constant, MD Cardiology APP:  Kennon Rounds     CC: Transition to new cardiologist  History of Present Illness: .    Joshua [name] is a 58 y.o. male with heart failure with reduced ejection fraction and bicuspid aortic valve who presents for established care.  He has a history of heart failure with reduced ejection fraction, which has since recovered to normal heart function with goal-directed medical therapy. His current medications include an SGLT2 inhibitor, aspirin, Crestor, Ozempic, and caldactone. He previously experienced sluggishness on bisoprolol, leading to its discontinuation. No chest pain, shortness of breath, or leg swelling is reported.  He has a bicuspid aortic valve with mild to moderate regurgitation and stenosis, along with a mild ascending aortic aneurysm. There is no family history of bicuspid aortic valve or aortic dissection. His son, who is almost 74, has not been screened for a bicuspid aortic valve.  He experiences episodes of tachycardia and has a history of 2:1 heart block. His heart rate typically returns to normal after resting. No chest pain, pressure, or shortness of breath occurs during physical activities such as working at [employer], which involves walking and climbing stairs.  He receives testosterone injections, necessitating quarterly blood donations to manage elevated red blood cell counts. His heart rate must be below 100 to donate blood, and he typically donates on Saturdays when not working.  He has a history of coronary ectasia and distal RCA dissection, which are stable with no current symptoms or complications.   Relevant histories: .  Social- former HS patient ROS: As per HPI.   Studies Reviewed: .   Cardiac Studies &  Procedures   ______________________________________________________________________________________________     ECHOCARDIOGRAM  ECHOCARDIOGRAM COMPLETE 07/17/2023  Narrative ECHOCARDIOGRAM REPORT    Patient Name:   Joshua [name] Date of Exam: 07/17/2023 Medical Rec #:  [id number]        Height:       75.0 in Accession #:    [id number]       Weight:       325.2 lb Date of Birth:  12/18/1965        BSA:          2.698 m Patient Age:    57 years         BP:           126/78 mmHg Patient Gender: M                HR:           78 bpm. Exam Location:  [address]  Procedure: 2D Echo, Cardiac Doppler and Color Doppler  Indications:    Q23.81 AS w/ bicuspid aortic valve  History:        Patient has prior history of Echocardiogram examinations, most recent 07/28/2021. Cardiomyopathy, Aortic Valve Disease and Bicuspid aortic valve, Signs/Symptoms:Ascending aortic aneurysm; Risk Factors:Hypertension, Dyslipidemia and Diabetes.  Sonographer:    Samule Ohm RDCS Referring Phys: [id number] Evern Bio WEAVER   Sonographer Comments: Patient is obese. Image acquisition challenging due to patient body habitus. IMPRESSIONS   1. Left ventricular ejection fraction, by estimation, is 55 to 60%. The left ventricle has normal function. The left ventricle has no regional wall motion abnormalities. There is moderate concentric left  ventricular hypertrophy. Left ventricular diastolic parameters are consistent with Grade I diastolic dysfunction (impaired relaxation). 2. Right ventricular systolic function is normal. The right ventricular size is normal. Tricuspid regurgitation signal is inadequate for assessing PA pressure. 3. The mitral valve is grossly normal. No evidence of mitral valve regurgitation. No evidence of mitral stenosis. 4. The aortic valve is calcified. There is mild calcification of the aortic valve. There is mild thickening of the aortic valve. Aortic valve regurgitation is mild. Mild  aortic valve stenosis. Aortic valve area, by VTI measures 1.66 cm. Aortic valve mean gradient measures 12.0 mmHg. Aortic valve Vmax measures 2.37 m/s. 5. There is mild dilatation of the ascending aorta, measuring 42 mm. 6. The inferior vena cava is normal in size with greater than 50% respiratory variability, suggesting right atrial pressure of 3 mmHg.  Comparison(s): No significant change from prior study.  FINDINGS Left Ventricle: Left ventricular ejection fraction, by estimation, is 55 to 60%. The left ventricle has normal function. The left ventricle has no regional wall motion abnormalities. The left ventricular internal cavity size was normal in size. There is moderate concentric left ventricular hypertrophy. Left ventricular diastolic parameters are consistent with Grade I diastolic dysfunction (impaired relaxation).  Right Ventricle: The right ventricular size is normal. No increase in right ventricular wall thickness. Right ventricular systolic function is normal. Tricuspid regurgitation signal is inadequate for assessing PA pressure.  Left Atrium: Left atrial size was normal in size.  Right Atrium: Right atrial size was normal in size.  Pericardium: There is no evidence of pericardial effusion.  Mitral Valve: The mitral valve is grossly normal. No evidence of mitral valve regurgitation. No evidence of mitral valve stenosis.  Tricuspid Valve: The tricuspid valve is grossly normal. Tricuspid valve regurgitation is not demonstrated. No evidence of tricuspid stenosis.  Aortic Valve: The aortic valve is calcified. There is mild calcification of the aortic valve. There is mild thickening of the aortic valve. Aortic valve regurgitation is mild. Aortic regurgitation PHT measures 492 msec. Mild aortic stenosis is present. Aortic valve mean gradient measures 12.0 mmHg. Aortic valve peak gradient measures 22.4 mmHg. Aortic valve area, by VTI measures 1.66 cm.  Pulmonic Valve: The pulmonic  valve was grossly normal. Pulmonic valve regurgitation is not visualized. No evidence of pulmonic stenosis.  Aorta: The aortic root is normal in size and structure. There is mild dilatation of the ascending aorta, measuring 42 mm.  Venous: The inferior vena cava is normal in size with greater than 50% respiratory variability, suggesting right atrial pressure of 3 mmHg.  IAS/Shunts: The atrial septum is grossly normal.   LEFT VENTRICLE PLAX 2D LVIDd:         4.60 cm   Diastology LVIDs:         3.10 cm   LV e' medial:    5.66 cm/s LV PW:         1.40 cm   LV E/e' medial:  12.6 LV IVS:        1.40 cm   LV e' lateral:   11.40 cm/s LVOT diam:     2.20 cm   LV E/e' lateral: 6.3 LV SV:         70 LV SV Index:   26 LVOT Area:     3.80 cm   RIGHT VENTRICLE             IVC RV S prime:     13.60 cm/s  IVC diam: 1.50 cm TAPSE (M-mode): 2.1 cm  LEFT ATRIUM           Index        RIGHT ATRIUM           Index LA diam:      3.30 cm 1.22 cm/m   RA Pressure: 3.00 mmHg LA Vol (A2C): 28.4 ml 10.53 ml/m  RA Area:     19.50 cm LA Vol (A4C): 64.4 ml 23.87 ml/m  RA Volume:   65.30 ml  24.20 ml/m AORTIC VALVE AV Area (Vmax):    1.67 cm AV Area (Vmean):   1.57 cm AV Area (VTI):     1.66 cm AV Vmax:           236.50 cm/s AV Vmean:          165.000 cm/s AV VTI:            0.424 m AV Peak Grad:      22.4 mmHg AV Mean Grad:      12.0 mmHg LVOT Vmax:         104.00 cm/s LVOT Vmean:        68.100 cm/s LVOT VTI:          0.185 m LVOT/AV VTI ratio: 0.44 AI PHT:            492 msec  AORTA Ao Root diam: 3.80 cm Ao Asc diam:  4.20 cm  MITRAL VALVE                TRICUSPID VALVE MV Area (PHT): 2.63 cm     Estimated RAP:  3.00 mmHg MV Decel Time: 289 msec MV E velocity: 71.30 cm/s   SHUNTS MV A velocity: 102.00 cm/s  Systemic VTI:  0.18 m MV E/A ratio:  0.70         Systemic Diam: 2.20 cm  Lennie Odor MD Electronically signed by Lennie Odor MD Signature Date/Time: 07/17/2023/12:45:13  PM    Final          ______________________________________________________________________________________________       Physical Exam:    VS:  BP 118/80 (BP Location: Right Arm)   Pulse (!) 105   Ht 6\' 4"  (1.93 m)   Wt (!) 138.5 kg   SpO2 94%   BMI 37.17 kg/m    Wt Readings from Last 3 Encounters:  09/07/23 (!) 138.5 kg  09/19/22 (!) 147.5 kg  07/06/21 (!) 149.5 kg    Gen: no distress Obese  Neck: No JVD Cardiac: No Rubs or Gallops, systolic Murmur, regular tachycardia, +2 radial pulses Respiratory: Clear to auscultation bilaterally, normal effort, normal  respiratory rate GI: Soft, nontender, non-distended  MS: No  edema;  moves all extremities Integument: Skin feels warm Neuro:  At time of evaluation, alert and oriented to person/place/time/situation  Psych: Normal affect, patient feels well   ASSESSMENT AND PLAN: .    Sinus Tachycardia Heart rate of 105 bpm confirmed as sinus tachycardia on EKG. Etiology unclear, with history of beta blockers causing sluggishness. Asymptomatic with no chest pain or dyspnea. Testosterone injections may increase atrial fibrillation risk. - Send heart monitor to confirm sinus rhythm and rule out atrial fibrillation. - Consider diltiazem if heart rate remains elevated and cardiac function is normal.  2:1 Heart Block 2:1 heart block, previously asymptomatic, led to discontinuation of beta blockers. Current elevated heart rate, asymptomatic. Potential need for future intervention if abnormalities detected. - Monitor heart rhythm with a heart monitor to assess for changes or progression. - Previously see by Fayrene Fearing  Allred (EP-MD)  Bicuspid Aortic Valve with Mild to Moderate Regurgitation and Stenosis - Bicuspid aortic valve with mild to moderate regurgitation and stenosis. Mild ascending aortic aneurysm present. Asymptomatic, but risk of earlier surgical intervention than tricuspid valve. Discussed family screening importance,  especially for his son, due to potential hereditary nature. - Discuss family screening for bicuspid aortic valve, especially for his son. - Plan echocardiogram in February 2026 to monitor condition.  Heart Failure with Recovered Ejection Fraction Heart failure with reduced ejection fraction, now recovered to normal with goal-directed medical therapy. Asymptomatic with no signs of heart failure such as chest pain, dyspnea, or leg swelling. - Continue current medical therapy and monitor for symptom changes.  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 Well-managed and asymptomatic. - Continue medications including aspirin and Crestor to manage coronary artery disease.  Follow-up Establishing care and requires follow-up to monitor cardiac conditions. - Schedule follow-up with Tereso Newcomer in one year.  Joshua Lam, MD FASE [facility] Cardiologist [facility]  [address] [place], [address] [place], [place] [address] [phone]  5:19 PM

## 2023-09-19 DIAGNOSIS — E291 Testicular hypofunction: Secondary | ICD-10-CM | POA: Diagnosis not present

## 2023-10-03 ENCOUNTER — Encounter: Payer: Self-pay | Admitting: Internal Medicine

## 2023-10-03 DIAGNOSIS — I459 Conduction disorder, unspecified: Secondary | ICD-10-CM

## 2023-10-03 DIAGNOSIS — R000 Tachycardia, unspecified: Secondary | ICD-10-CM | POA: Diagnosis not present

## 2023-10-03 DIAGNOSIS — E291 Testicular hypofunction: Secondary | ICD-10-CM | POA: Diagnosis not present

## 2023-10-17 DIAGNOSIS — E291 Testicular hypofunction: Secondary | ICD-10-CM | POA: Diagnosis not present

## 2023-10-27 IMAGING — CT CT ANGIO CHEST
3 of 9 series · 18 of 46 positions shown · IV contrast (OMNIPAQUE 350)
Comparison: None.

CLINICAL DATA: Aortic atherosclerosis ascending aorta. Ascending
aortic aneurysm.

EXAM:
CT ANGIOGRAPHY CHEST WITH CONTRAST
TECHNIQUE: Multidetector CT imaging of the chest was performed using the
standard protocol during bolus administration of intravenous
contrast. Multiplanar CT image reconstructions and MIPs were
obtained to evaluate the vascular anatomy.

[Series 5: aorta 3.0 bf37 2 · axial · 0.79mm/px · z∈[-338,-74]mm · 12 of 105 slices shown]
[im 9/105  lung]
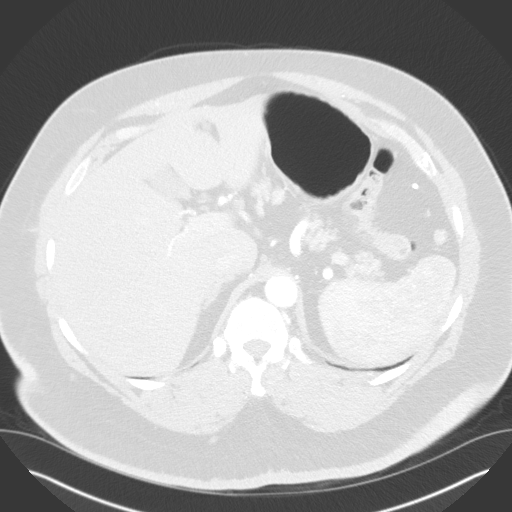
[im 17/105  soft-tissue]
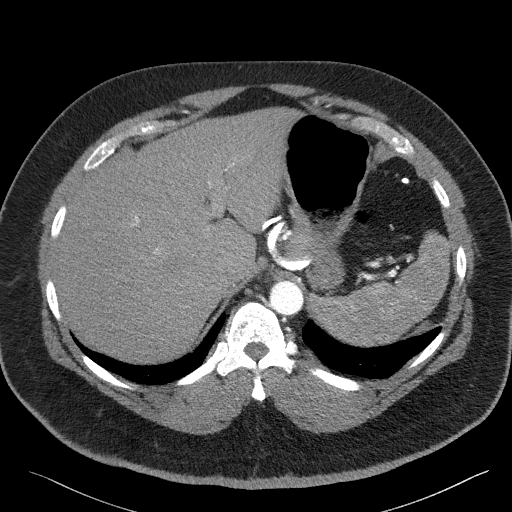
[im 25/105  lung]
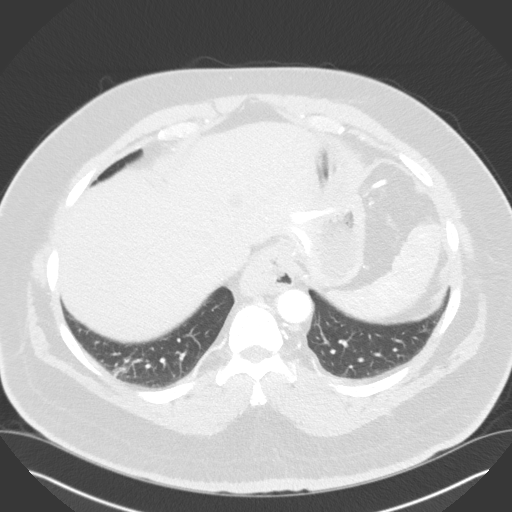
[im 33/105  soft-tissue]
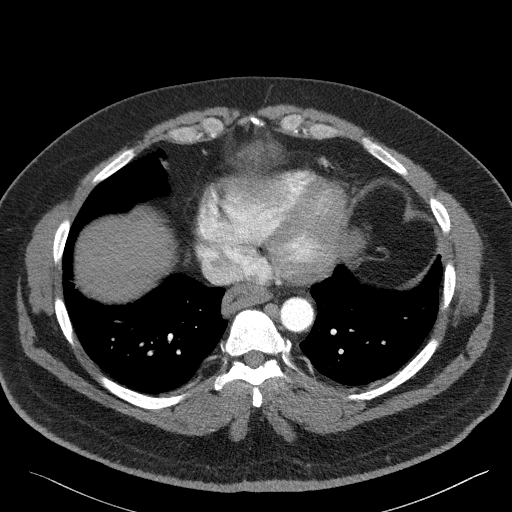
[im 41/105  lung]
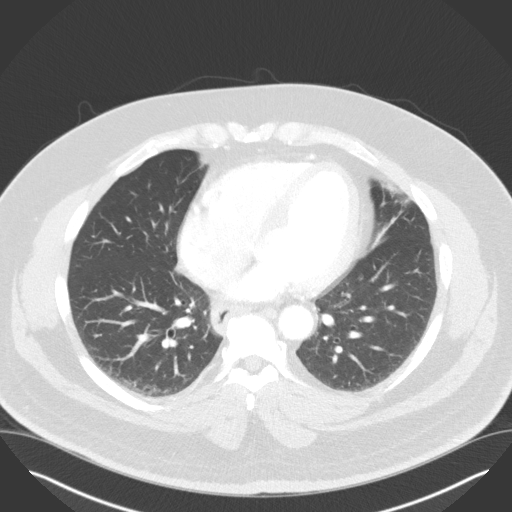
[im 49/105  soft-tissue]
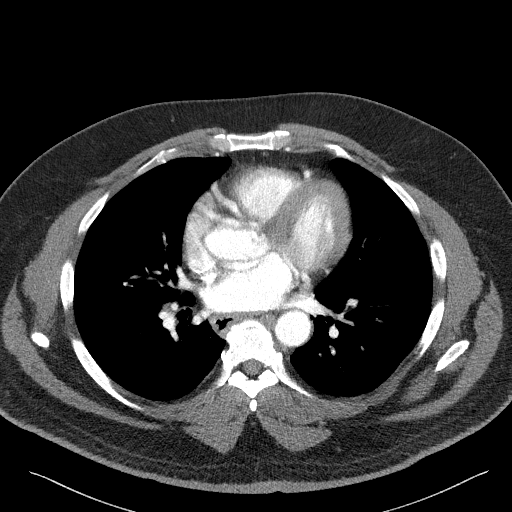
[im 57/105  lung]
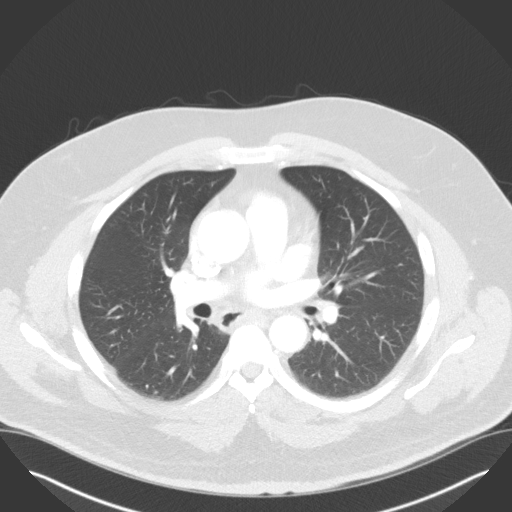
[im 65/105  soft-tissue]
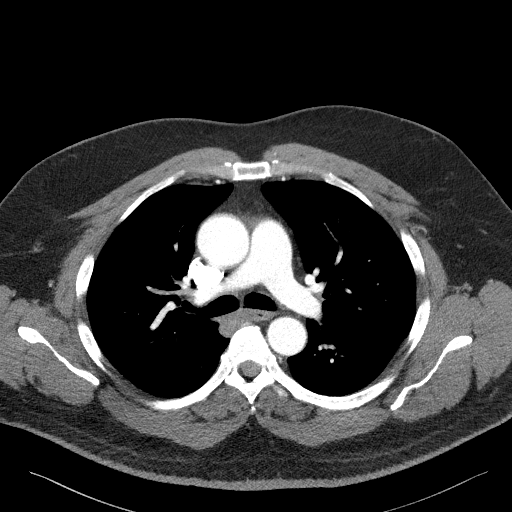
[im 73/105  lung]
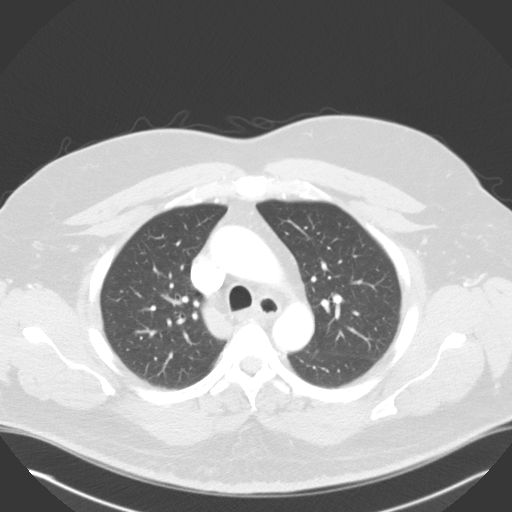
[im 81/105  soft-tissue]
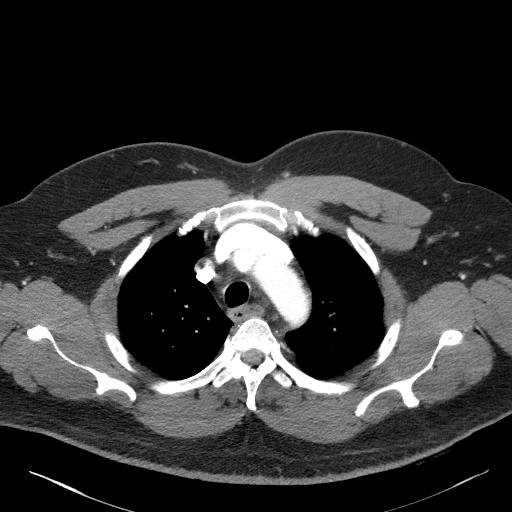
[im 89/105  lung]
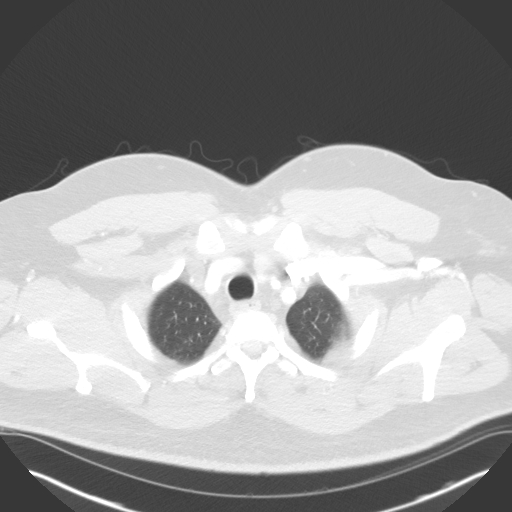
[im 97/105  soft-tissue]
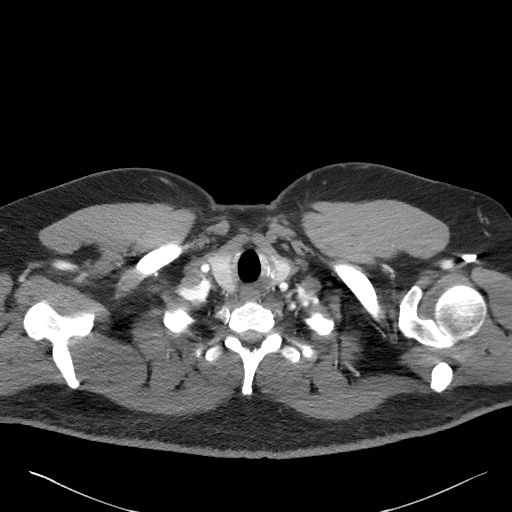

[Series 6: lung · axial · 0.79mm/px · z∈[-340,-250]mm · 3 of 105 slices shown]
[im 8/105  soft-tissue]
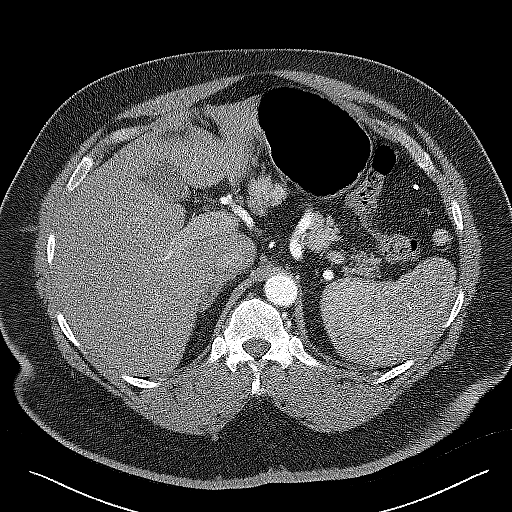
[im 23/105  soft-tissue]
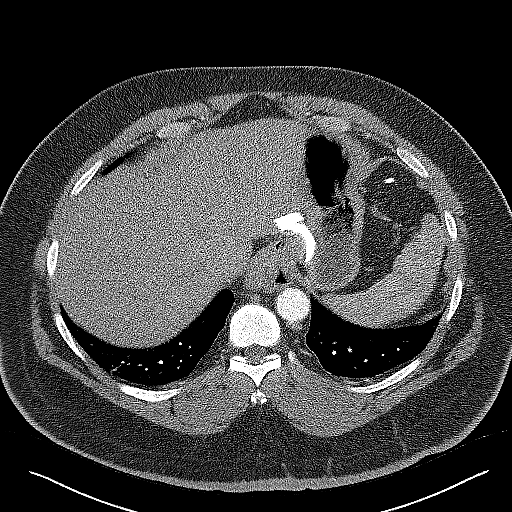
[im 38/105  soft-tissue]
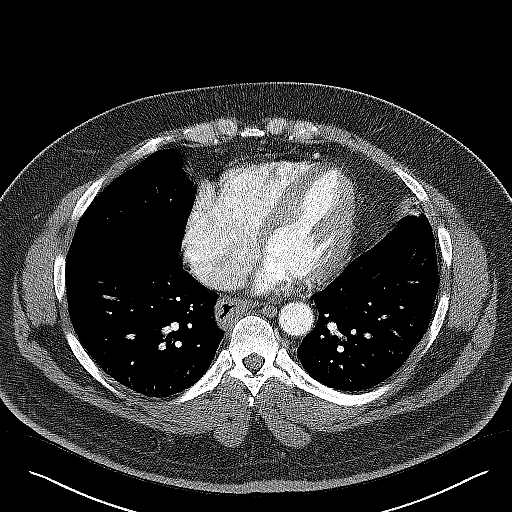

[Series 8: coronals · coronal · 0.61mm/px · 3 of 129 slices shown]
[im 33/129  soft-tissue]
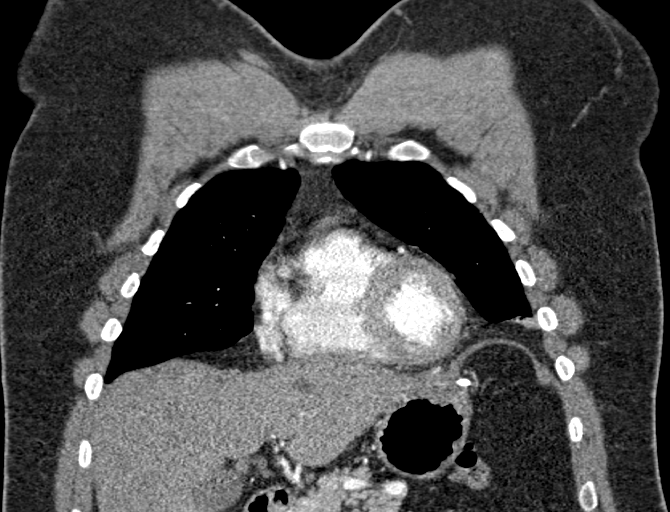
[im 65/129  soft-tissue]
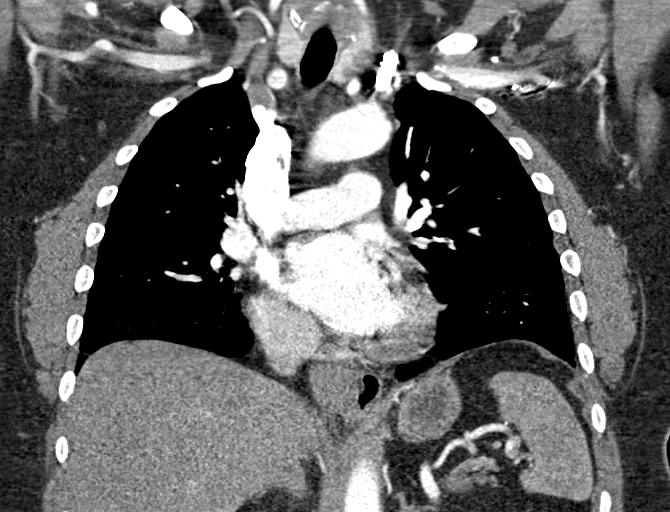
[im 97/129  soft-tissue]
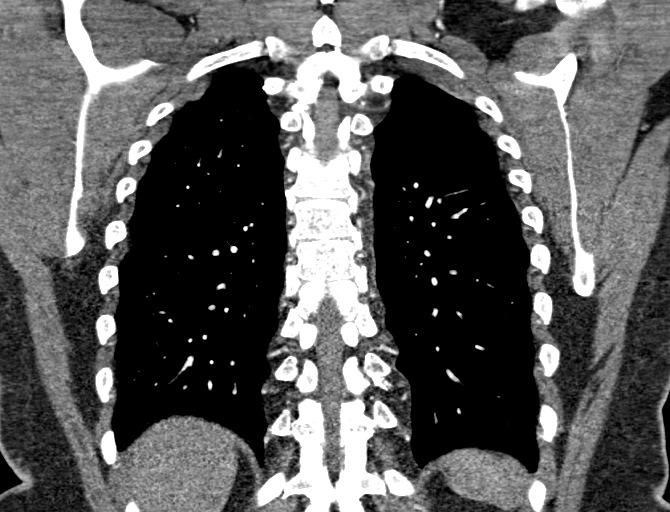

[18 of 46 positions shown; findings below may reference images not displayed]

RADIATION DOSE REDUCTION: This exam was performed according to the
departmental dose-optimization program which includes automated
exposure control, adjustment of the mA and/or kV according to
patient size and/or use of iterative reconstruction technique.

CONTRAST:  100mL OMNIPAQUE IOHEXOL 350 MG/ML SOLN
FINDINGS: Cardiovascular: Mild aneurysmal dilatation of the ascending thoracic
aorta measuring up to 4 cm. Aortic arch and descending thoracic
aorta normal caliber. Heart normal size.

Mediastinum/Nodes: Small hiatal hernia. No mediastinal, hilar, or
axillary adenopathy. Trachea and esophagus are unremarkable. Thyroid
unremarkable.

Lungs/Pleura: Bibasilar scarring. No confluent opacities or
effusions.

Upper Abdomen: Lap band device noted at the GE junction. No acute
abnormality.

Musculoskeletal: Chest wall soft tissues are unremarkable. No acute
bony abnormality.

Review of the MIP images confirms the above findings.
IMPRESSION: 4 cm ascending thoracic aortic aneurysm. Recommend annual imaging
followup by CTA or MRA. This recommendation follows 7353
ACCF/AHA/AATS/ACR/ASA/SCA/VIGFUS/ALEXANDRAA/ERMINIA/MASCENA Guidelines for the
Diagnosis and Management of Patients with Thoracic Aortic Disease.
Circulation. 7353; 121: E266-e369. Aortic aneurysm NOS (V65FR-N90.Z)

No acute cardiopulmonary disease.

## 2023-10-31 DIAGNOSIS — E291 Testicular hypofunction: Secondary | ICD-10-CM | POA: Diagnosis not present

## 2023-11-08 ENCOUNTER — Ambulatory Visit: Admitting: Podiatry

## 2023-11-08 DIAGNOSIS — E291 Testicular hypofunction: Secondary | ICD-10-CM | POA: Diagnosis not present

## 2023-11-12 ENCOUNTER — Ambulatory Visit (INDEPENDENT_AMBULATORY_CARE_PROVIDER_SITE_OTHER)

## 2023-11-12 ENCOUNTER — Ambulatory Visit: Admitting: Podiatry

## 2023-11-12 ENCOUNTER — Encounter: Payer: Self-pay | Admitting: Podiatry

## 2023-11-12 DIAGNOSIS — M2142 Flat foot [pes planus] (acquired), left foot: Secondary | ICD-10-CM

## 2023-11-12 DIAGNOSIS — M2141 Flat foot [pes planus] (acquired), right foot: Secondary | ICD-10-CM

## 2023-11-12 DIAGNOSIS — E1142 Type 2 diabetes mellitus with diabetic polyneuropathy: Secondary | ICD-10-CM | POA: Diagnosis not present

## 2023-11-12 DIAGNOSIS — B351 Tinea unguium: Secondary | ICD-10-CM

## 2023-11-12 NOTE — Progress Notes (Signed)
 Subjective:  Patient ID: Joshua [name], male    DOB: 1966/04/30,   MRN: [id number]  Chief Complaint  Patient presents with   Nail Problem    Rm 22/ patient requesting orthotics and nail fungus bilateral/diabetic bloodsugar 84    59 y.o. male presents for concern as above. Would like to treat fungus. He has also had custom orthotics in past for feet and interested in getting another pair. . Relates burning and tingling in their feet. Patient is diabetic and last A1c was No results found for: "HGBA1C" .   PCP:  Roselind Congo, MD   . Denies any other pedal complaints. Denies n/v/f/c.   Past Medical History:  Diagnosis Date   (HFimpEF) heart failure with improved EF 06/02/2011   Non-ischemic cardiomyopathy // EF returned to normal.  LVEF by echo 40% on 05/31/11 Echocardiogram 12/15: EF 55-60 Echocardiogram 10/19: EF 65-70   Aortic stenosis    a. 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     DM2 (diabetes mellitus, type 2) (HCC)    H/O hiatal hernia    Hyperlipidemia    Hypertension    Hypogonadism male    Kidney stone    nephrolithiasis   Nonischemic cardiomyopathy (HCC)    Second degree AV block    Resolved off of beta blockers   Sleep apnea    Thoracic aortic aneurysm (HCC) 07/28/2021   Echo 2/23:Ascending aorta mildly dilated (43 mm) Chest CTA 08/17/2021: Ascending thoracic aortic aneurysm 4 cm    Objective:  Physical Exam: Vascular: DP/PT pulses 2/4 bilateral. CFT <3 seconds. Normal hair growth on digits. No edema.  Skin. No lacerations or abrasions bilateral feet. Nails 1-5  bilateral are thickened dystrophic and discolorared Musculoskeletal: MMT 5/5 bilateral lower extremities in DF, PF, Inversion and Eversion. Deceased ROM in DF of ankle joint. Mild pes planus noted bilateral. No pain to palpation. Some pain with Rom of the right ankle and left midfoot rotation.  Neurological: Sensation intact to light touch.   Assessment:   1. Bilateral pes planus   2. Onychomycosis   3. Type 2 diabetes mellitus with peripheral neuropathy (HCC)      Plan:  Patient was evaluated and treated and all questions answered. X-rays reviewed and discussed with patient. No acute fractures or dislocations noted. Mild collapse of the central arch bilateral with spurring noted to inferior and posterior calcaneus Bilateral. Some degenerative changes noted to midfoot on left.  Some degenerative changes noted to right ankle.  -Discussed treatement options; discussed pes planus deformity;conservative and  surgical  -Rx Orthotics information given  -Recommend good supportive shoes -Recommend daily stretching and icing -Examined patient -Discussed treatment options for painful dystrophic nails  -Clinical picture and Fungal culture was obtained by removing a portion of the hard nail itself from each of the involved toenails using a sterile nail nipper and sent to [facility] lab. Patient tolerated the biopsy procedure well without discomfort or need for anesthesia.  -Discussed fungal nail treatment options including oral, topical,  and laser treatments.  -Patient to return in 4 weeks for follow up evaluation and discussion of fungal culture results or sooner if symptoms worsen.   Jennefer Moats, DPM

## 2023-11-15 DIAGNOSIS — N400 Enlarged prostate without lower urinary tract symptoms: Secondary | ICD-10-CM | POA: Diagnosis not present

## 2023-11-15 DIAGNOSIS — E291 Testicular hypofunction: Secondary | ICD-10-CM | POA: Diagnosis not present

## 2023-11-15 DIAGNOSIS — N5201 Erectile dysfunction due to arterial insufficiency: Secondary | ICD-10-CM | POA: Diagnosis not present

## 2023-11-27 ENCOUNTER — Other Ambulatory Visit: Payer: Self-pay | Admitting: Podiatry

## 2023-11-28 DIAGNOSIS — E291 Testicular hypofunction: Secondary | ICD-10-CM | POA: Diagnosis not present

## 2023-12-11 ENCOUNTER — Other Ambulatory Visit: Payer: Self-pay | Admitting: Physician Assistant

## 2023-12-12 DIAGNOSIS — E291 Testicular hypofunction: Secondary | ICD-10-CM | POA: Diagnosis not present

## 2023-12-19 ENCOUNTER — Encounter: Payer: Self-pay | Admitting: Podiatry

## 2023-12-19 ENCOUNTER — Encounter: Payer: Self-pay | Admitting: Internal Medicine

## 2023-12-19 ENCOUNTER — Ambulatory Visit: Admitting: Podiatry

## 2023-12-19 DIAGNOSIS — E1142 Type 2 diabetes mellitus with diabetic polyneuropathy: Secondary | ICD-10-CM

## 2023-12-19 DIAGNOSIS — B351 Tinea unguium: Secondary | ICD-10-CM

## 2023-12-19 MED ORDER — TERBINAFINE HCL 250 MG PO TABS: 250.0000 mg | ORAL_TABLET | Freq: Every day | ORAL | 2 refills | Status: AC

## 2023-12-19 NOTE — Progress Notes (Signed)
  Subjective:  Patient ID: Joshua [name], male    DOB: 02-14-1966,   MRN: [id number]  No chief complaint on file.   58 y.o. male presents for follow-up of fungal nails and to discuss culture results.  Relates burning and tingling in their feet. Patient is diabetic and last A1c was No results found for: HGBA1C .   PCP:  Arloa Elsie SAUNDERS, MD   . Denies any other pedal complaints. Denies n/v/f/c.   Past Medical History:  Diagnosis Date   (HFimpEF) heart failure with improved EF 06/02/2011   Non-ischemic cardiomyopathy // EF returned to normal.  LVEF by echo 40% on 05/31/11 Echocardiogram 12/15: EF 55-60 Echocardiogram 10/19: EF 65-70   Aortic stenosis    a. 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     DM2 (diabetes mellitus, type 2) (HCC)    H/O hiatal hernia    Hyperlipidemia    Hypertension    Hypogonadism male    Kidney stone    nephrolithiasis   Nonischemic cardiomyopathy (HCC)    Second degree AV block    Resolved off of beta blockers   Sleep apnea    Thoracic aortic aneurysm (HCC) 07/28/2021   Echo 2/23:Ascending aorta mildly dilated (43 mm) Chest CTA 08/17/2021: Ascending thoracic aortic aneurysm 4 cm    Objective:  Physical Exam: Vascular: DP/PT pulses 2/4 bilateral. CFT <3 seconds. Normal hair growth on digits. No edema.  Skin. No lacerations or abrasions bilateral feet. Nails 1-5 bilateral are thickened dystrophic and discolorared Musculoskeletal: MMT 5/5 bilateral lower extremities in DF, PF, Inversion and Eversion. Deceased ROM in DF of ankle joint. Mild pes planus noted  bilateral. No pain to palpation. Some pain with Rom of the right ankle and left midfoot rotation.  Neurological: Sensation intact to light touch.   Assessment:   1. Onychomycosis   2. Type 2 diabetes mellitus with peripheral neuropathy (HCC)      Plan:  Patient was evaluated and treated and all questions answered. X-rays reviewed and discussed with patient. No acute fractures or dislocations noted. Mild collapse of the central arch bilateral with spurring noted to inferior and posterior calcaneus Bilateral. Some degenerative changes noted to midfoot on left.  Some degenerative changes noted to right ankle.  -Discussed treatement options; discussed pes planus deformity;conservative and  surgical  -Awaiting orthotics.   -Recommend good supportive shoes -Recommend daily stretching and icing -Examined patient -Discussed treatment options for painful dystrophic nails  -Culture positive for fungus. T rubrum.  -Discussed fungal nail treatment options including oral, topical, and laser treatments.  -Patient elects to try lamisil . Sent to pharmacy   -LFTS are within normal limits.  -Patient to return in 3 months.    Asberry Failing, DPM

## 2023-12-20 ENCOUNTER — Telehealth: Payer: Self-pay | Admitting: Pharmacy Technician

## 2023-12-20 ENCOUNTER — Other Ambulatory Visit (HOSPITAL_COMMUNITY): Payer: Self-pay

## 2023-12-20 NOTE — Telephone Encounter (Signed)
  PER TEST CLAIM IT WILL GO THROUGH FOR 7.63 FOR 30 DAYS. I CALLED [facility] AND THEY SAID IT WENT THROUGH FOR 22.13 FOR 90 DAYS. TRIED TO CALL PT AND WENT TO VM SO SENT PT A MYCHART

## 2023-12-26 DIAGNOSIS — E291 Testicular hypofunction: Secondary | ICD-10-CM | POA: Diagnosis not present

## 2024-01-01 DIAGNOSIS — Z0000 Encounter for general adult medical examination without abnormal findings: Secondary | ICD-10-CM | POA: Diagnosis not present

## 2024-01-01 DIAGNOSIS — E7800 Pure hypercholesterolemia, unspecified: Secondary | ICD-10-CM | POA: Diagnosis not present

## 2024-01-01 DIAGNOSIS — I429 Cardiomyopathy, unspecified: Secondary | ICD-10-CM | POA: Diagnosis not present

## 2024-01-01 DIAGNOSIS — I10 Essential (primary) hypertension: Secondary | ICD-10-CM | POA: Diagnosis not present

## 2024-01-01 DIAGNOSIS — E119 Type 2 diabetes mellitus without complications: Secondary | ICD-10-CM | POA: Diagnosis not present

## 2024-01-09 DIAGNOSIS — E291 Testicular hypofunction: Secondary | ICD-10-CM | POA: Diagnosis not present

## 2024-01-10 ENCOUNTER — Ambulatory Visit (INDEPENDENT_AMBULATORY_CARE_PROVIDER_SITE_OTHER)

## 2024-01-10 DIAGNOSIS — M2141 Flat foot [pes planus] (acquired), right foot: Secondary | ICD-10-CM

## 2024-01-10 DIAGNOSIS — M21072 Valgus deformity, not elsewhere classified, left ankle: Secondary | ICD-10-CM

## 2024-01-10 DIAGNOSIS — M21071 Valgus deformity, not elsewhere classified, right ankle: Secondary | ICD-10-CM

## 2024-01-10 DIAGNOSIS — M2142 Flat foot [pes planus] (acquired), left foot: Secondary | ICD-10-CM | POA: Diagnosis not present

## 2024-01-10 NOTE — Progress Notes (Signed)
 [employer] $1000 deductible met with 20% coins  Deuctible has been met will owe coins amount   Patient was present and evaluated for Custom molded foot orthotics. Patient will benefit from CFO's to provide total contact to BIL MLA's helping to balance and distribute body weight more evenly across BIL feet helping to reduce plantar pressure and pain. Orthotic will also encourage FF / RF alignment  Patient was scanned today and will return for fitting upon receipt   Lolita Schultze CPed, CFo , CFm

## 2024-01-23 DIAGNOSIS — E291 Testicular hypofunction: Secondary | ICD-10-CM | POA: Diagnosis not present

## 2024-02-06 DIAGNOSIS — E291 Testicular hypofunction: Secondary | ICD-10-CM | POA: Diagnosis not present

## 2024-02-07 ENCOUNTER — Other Ambulatory Visit

## 2024-02-08 ENCOUNTER — Other Ambulatory Visit

## 2024-02-21 DIAGNOSIS — E291 Testicular hypofunction: Secondary | ICD-10-CM | POA: Diagnosis not present

## 2024-02-28 ENCOUNTER — Ambulatory Visit

## 2024-02-28 NOTE — Progress Notes (Signed)
 Patient presents today to pick up custom molded foot orthotics, diagnosed with Valgus deformity BIL and Pes Planus  by Dr. Sikora.   Orthotics were dispensed and fit was satisfactory. Reviewed instructions for break-in and wear. Written instructions given to patient.  Patient will follow up as needed.   Lolita Schultze CPed, CFo, CFm

## 2024-03-05 DIAGNOSIS — E291 Testicular hypofunction: Secondary | ICD-10-CM | POA: Diagnosis not present

## 2024-03-13 ENCOUNTER — Other Ambulatory Visit: Payer: Self-pay | Admitting: Podiatry

## 2024-03-19 DIAGNOSIS — E291 Testicular hypofunction: Secondary | ICD-10-CM | POA: Diagnosis not present

## 2024-03-24 ENCOUNTER — Ambulatory Visit: Admitting: Podiatry

## 2024-03-24 ENCOUNTER — Encounter: Payer: Self-pay | Admitting: Podiatry

## 2024-03-24 DIAGNOSIS — E1142 Type 2 diabetes mellitus with diabetic polyneuropathy: Secondary | ICD-10-CM

## 2024-03-24 DIAGNOSIS — B351 Tinea unguium: Secondary | ICD-10-CM

## 2024-03-24 NOTE — Progress Notes (Signed)
  Subjective:  Patient ID: Joshua [name], male    DOB: 11-10-1965,   MRN: [id number]  Chief Complaint  Patient presents with   Nail Problem    Pt feels nails have improved. No pain reported. Diabetic A1c 6.9 July, 2025. No anti coag.    58 y.o. male presents for follow-up of fungal nails . Has been talkin the lamisi. .   Relates burning and tingling in their feet. Patient is diabetic and last A1c was No results found for: HGBA1C .   PCP:  Arloa Elsie SAUNDERS, MD   . Denies any other pedal complaints. Denies n/v/f/c.   Past Medical History:  Diagnosis Date   (HFimpEF) heart failure with improved EF 06/02/2011   Non-ischemic cardiomyopathy // EF returned to normal.  LVEF by echo 40% on 05/31/11 Echocardiogram 12/15: EF 55-60 Echocardiogram 10/19: EF 65-70   Aortic stenosis    a. Echo 12/15 - EF 55-60%, normal WM, Gr 1 DD, mild AS, mild AI, mean AV 18 mmHg // Echo 03/12/2018: mild LVH, EF 65-70, no RWMA, Gr 1 DD, bicuspid AV, mild AS (mean 17, peak 28), mild AI    Arthritis    LEFT KNEE   Ascending aorta dilation 07/28/2021   Echo 2/23:Ascending aorta mildly dilated (43 mm)   Coronary artery ectasia    LHC (12/12):  Patent but Ectatic LAD and RCA (? 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     DM2 (diabetes mellitus, type 2) (HCC)    H/O hiatal hernia    Hyperlipidemia    Hypertension    Hypogonadism male    Kidney stone    nephrolithiasis   Nonischemic cardiomyopathy (HCC)    Second degree AV block    Resolved off of beta blockers   Sleep apnea    Thoracic aortic aneurysm 07/28/2021   Echo 2/23:Ascending aorta mildly dilated (43 mm) Chest CTA 08/17/2021: Ascending thoracic aortic aneurysm 4 cm    Objective:  Physical Exam: Vascular: DP/PT pulses 2/4 bilateral. CFT <3 seconds. Normal hair growth on digits. No edema.  Skin. No lacerations or abrasions bilateral feet. Nails 1-5 bilateral are thickened dystrophic and discolorared Musculoskeletal: MMT 5/5 bilateral lower  extremities in DF, PF, Inversion and Eversion. Deceased ROM in DF of ankle joint. Mild pes planus noted bilateral. No pain to palpation. Some pain with Rom of the right ankle and left midfoot rotation.  Neurological: Sensation intact to light touch.   Assessment:   1. Onychomycosis   2. Type 2 diabetes mellitus with peripheral neuropathy (HCC)      Plan:  Patient was evaluated and treated and all questions answered. -Discussed and educated patient on diabetic foot care, especially with  regards to the vascular, neurological and musculoskeletal systems.  -Stressed the importance of good glycemic control and the detriment of not  controlling glucose levels in relation to the foot. -Discussed supportive shoes at all times and checking feet regularly.  -Answered all patient questions -Nails improving. No need to continue lamisil .  -Patient to return  in 1 year for DM foot check.  -Patient advised to call the office if any problems or questions arise in the meantime.     Asberry Failing, DPM

## 2024-04-02 DIAGNOSIS — E291 Testicular hypofunction: Secondary | ICD-10-CM | POA: Diagnosis not present

## 2024-04-16 DIAGNOSIS — E291 Testicular hypofunction: Secondary | ICD-10-CM | POA: Diagnosis not present

## 2024-04-30 DIAGNOSIS — E291 Testicular hypofunction: Secondary | ICD-10-CM | POA: Diagnosis not present

## 2024-05-14 DIAGNOSIS — E291 Testicular hypofunction: Secondary | ICD-10-CM | POA: Diagnosis not present

## 2024-05-28 DIAGNOSIS — E291 Testicular hypofunction: Secondary | ICD-10-CM | POA: Diagnosis not present

## 2024-06-11 DIAGNOSIS — E291 Testicular hypofunction: Secondary | ICD-10-CM | POA: Diagnosis not present

## 2024-07-24 ENCOUNTER — Ambulatory Visit (HOSPITAL_COMMUNITY)
